# Patient Record
Sex: Male | Born: 1970 | Race: White | Hispanic: No | Marital: Married | Smoking: Never smoker
Health system: Southern US, Community
[De-identification: ages and names within clinical notes are randomized; demographics above are authoritative.]

## PROBLEM LIST (undated history)

## (undated) DIAGNOSIS — Z9889 Other specified postprocedural states: Secondary | ICD-10-CM

## (undated) DIAGNOSIS — N200 Calculus of kidney: Secondary | ICD-10-CM

## (undated) DIAGNOSIS — R112 Nausea with vomiting, unspecified: Secondary | ICD-10-CM

## (undated) DIAGNOSIS — F329 Major depressive disorder, single episode, unspecified: Secondary | ICD-10-CM

## (undated) DIAGNOSIS — F32A Depression, unspecified: Secondary | ICD-10-CM

## (undated) DIAGNOSIS — F419 Anxiety disorder, unspecified: Secondary | ICD-10-CM

## (undated) HISTORY — DX: Calculus of kidney: N20.0

## (undated) HISTORY — PX: EYE SURGERY: SHX253

## (undated) HISTORY — PX: FRACTURE SURGERY: SHX138

---

## 1992-07-13 HISTORY — PX: FEMUR SURGERY: SHX943

## 1998-11-02 ENCOUNTER — Emergency Department (HOSPITAL_COMMUNITY): Admission: EM | Admit: 1998-11-02 | Discharge: 1998-11-02 | Payer: Self-pay | Admitting: Emergency Medicine

## 2013-10-18 ENCOUNTER — Ambulatory Visit
Admission: RE | Admit: 2013-10-18 | Discharge: 2013-10-18 | Disposition: A | Discharge status: Home / Self Care | Payer: BC Managed Care – PPO | Source: Ambulatory Visit | Attending: Family Medicine | Admitting: Family Medicine

## 2013-10-18 ENCOUNTER — Other Ambulatory Visit: Payer: Self-pay | Admitting: Family Medicine

## 2013-10-18 DIAGNOSIS — R109 Unspecified abdominal pain: Secondary | ICD-10-CM

## 2013-10-18 DIAGNOSIS — R17 Unspecified jaundice: Secondary | ICD-10-CM

## 2013-10-19 ENCOUNTER — Ambulatory Visit (HOSPITAL_COMMUNITY): Payer: BC Managed Care – PPO

## 2013-10-19 ENCOUNTER — Other Ambulatory Visit: Payer: Self-pay | Admitting: Gastroenterology

## 2013-10-19 ENCOUNTER — Encounter (HOSPITAL_COMMUNITY): Payer: Self-pay | Admitting: *Deleted

## 2013-10-19 ENCOUNTER — Ambulatory Visit (HOSPITAL_COMMUNITY)
Admission: AD | Admit: 2013-10-19 | Discharge: 2013-10-19 | Disposition: A | Discharge status: Home / Self Care | Payer: BC Managed Care – PPO | Source: Ambulatory Visit | Attending: Gastroenterology | Admitting: Gastroenterology

## 2013-10-19 ENCOUNTER — Encounter (HOSPITAL_COMMUNITY)
Admission: AD | Disposition: A | Discharge status: Home / Self Care | Payer: Self-pay | Source: Ambulatory Visit | Attending: Gastroenterology

## 2013-10-19 DIAGNOSIS — K838 Other specified diseases of biliary tract: Secondary | ICD-10-CM | POA: Insufficient documentation

## 2013-10-19 DIAGNOSIS — R748 Abnormal levels of other serum enzymes: Secondary | ICD-10-CM | POA: Insufficient documentation

## 2013-10-19 DIAGNOSIS — K560 Paralytic ileus: Secondary | ICD-10-CM | POA: Insufficient documentation

## 2013-10-19 DIAGNOSIS — R1013 Epigastric pain: Secondary | ICD-10-CM

## 2013-10-19 DIAGNOSIS — R17 Unspecified jaundice: Secondary | ICD-10-CM | POA: Insufficient documentation

## 2013-10-19 HISTORY — DX: Nausea with vomiting, unspecified: R11.2

## 2013-10-19 HISTORY — DX: Major depressive disorder, single episode, unspecified: F32.9

## 2013-10-19 HISTORY — DX: Other specified postprocedural states: Z98.890

## 2013-10-19 HISTORY — DX: Depression, unspecified: F32.A

## 2013-10-19 HISTORY — DX: Anxiety disorder, unspecified: F41.9

## 2013-10-19 HISTORY — PX: ERCP: SHX5425

## 2013-10-19 SURGERY — ERCP, WITH INTERVENTION IF INDICATED
Anesthesia: Moderate Sedation

## 2013-10-19 MED ORDER — BUTAMBEN-TETRACAINE-BENZOCAINE 2-2-14 % EX AERO
INHALATION_SPRAY | CUTANEOUS | Status: DC | PRN
  Administered 2013-10-19: 2 via TOPICAL

## 2013-10-19 MED ORDER — FENTANYL CITRATE 0.05 MG/ML IJ SOLN
INTRAMUSCULAR | Status: DC | PRN
  Administered 2013-10-19 (×8): 25 ug via INTRAVENOUS

## 2013-10-19 MED ORDER — FENTANYL CITRATE 0.05 MG/ML IJ SOLN
INTRAMUSCULAR | Status: AC
  Filled 2013-10-19: qty 6

## 2013-10-19 MED ORDER — DIPHENHYDRAMINE HCL 50 MG/ML IJ SOLN
INTRAMUSCULAR | Status: DC | PRN
  Administered 2013-10-19 (×2): 25 mg via INTRAVENOUS

## 2013-10-19 MED ORDER — CIPROFLOXACIN IN D5W 400 MG/200ML IV SOLN
400.0000 mg | Freq: Once | INTRAVENOUS | Status: AC
  Administered 2013-10-19: 400 mg via INTRAVENOUS

## 2013-10-19 MED ORDER — DIPHENHYDRAMINE HCL 50 MG/ML IJ SOLN
INTRAMUSCULAR | Status: AC
  Filled 2013-10-19: qty 1

## 2013-10-19 MED ORDER — IOHEXOL 350 MG/ML SOLN
INTRAVENOUS | Status: DC | PRN
  Administered 2013-10-19: 15:00:00

## 2013-10-19 MED ORDER — MIDAZOLAM HCL 10 MG/2ML IJ SOLN
INTRAMUSCULAR | Status: DC | PRN
  Administered 2013-10-19 (×3): 2 mg via INTRAVENOUS
  Administered 2013-10-19: 1 mg via INTRAVENOUS
  Administered 2013-10-19: 2.5 mg via INTRAVENOUS
  Administered 2013-10-19: 2 mg via INTRAVENOUS
  Administered 2013-10-19: 2.5 mg via INTRAVENOUS
  Administered 2013-10-19 (×2): 2 mg via INTRAVENOUS

## 2013-10-19 MED ORDER — GLUCAGON HCL (RDNA) 1 MG IJ SOLR
INTRAMUSCULAR | Status: AC
  Filled 2013-10-19: qty 2

## 2013-10-19 MED ORDER — GLUCAGON HCL (RDNA) 1 MG IJ SOLR
INTRAMUSCULAR | Status: DC | PRN
  Administered 2013-10-19: .5 mg via INTRAVENOUS

## 2013-10-19 MED ORDER — MIDAZOLAM HCL 10 MG/2ML IJ SOLN
INTRAMUSCULAR | Status: AC
  Filled 2013-10-19: qty 4

## 2013-10-19 MED ORDER — ONDANSETRON HCL 4 MG/2ML IJ SOLN
INTRAMUSCULAR | Status: AC
  Filled 2013-10-19: qty 2

## 2013-10-19 MED ORDER — SODIUM CHLORIDE 0.9 % IV SOLN
INTRAVENOUS | Status: DC
  Administered 2013-10-19: 500 mL via INTRAVENOUS

## 2013-10-19 MED ORDER — CIPROFLOXACIN IN D5W 400 MG/200ML IV SOLN
INTRAVENOUS | Status: AC
  Filled 2013-10-19: qty 200

## 2013-10-19 NOTE — Discharge Instructions (Signed)
Diet The clear liquid diet consists of foods that are liquid or will become liquid at room temperature. Examples of foods allowed on a clear liquid diet include fruit juice, broth or bouillon, gelatin, or frozen ice pops. You should be able to see through the liquid. The purpose of this diet is to provide the necessary fluids, electrolytes (such as sodium and potassium), and energy to keep the body functioning during times when you are not able to consume a regular diet. A clear liquid diet should not be continued for long periods of time, as it is not nutritionally adequate.  A CLEAR LIQUID DIET MAY BE NEEDED:  When a sudden-onset (acute) condition occurs before or after surgery.   As the first step in oral feeding.   For fluid and electrolyte replacement in diarrheal diseases.   As a diet before certain medical tests are performed.  ADEQUACY The clear liquid diet is adequate only in ascorbic acid, according to the Recommended Dietary Allowances of the National Research Council.  CHOOSING FOODS Breads and Starches  Allowed: None are allowed.   Avoid: All are to be avoided.  Vegetables  Allowed: Strained vegetable juices.   Avoid: Any others.  Fruit  Allowed: Strained fruit juices and fruit drinks. Include 1 serving of citrus or vitamin C-enriched fruit juice daily.   Avoid: Any others.  Meat and Meat Substitutes  Allowed: None are allowed.   Avoid: All are to be avoided.  Milk Products  Allowed: None are allowed.   Avoid: All are to be avoided.  Soups and Combination Foods  Allowed: Clear bouillon, broth, or strained broth-based soups.   Avoid: Any others.  Desserts and Sweets  Allowed: Sugar, honey. High-protein gelatin. Flavored gelatin, ices, or frozen ice pops that do not contain milk.   Avoid: Any others.  Fats and Oils  Allowed: None are allowed.   Avoid: All are to be avoided.  Beverages  Allowed: Cereal  beverages, coffee (regular or decaffeinated), tea, or soda at the discretion of your health care provider.   Avoid: Any others.  Condiments  Allowed: Salt.   Avoid: Any others, including pepper.  Supplements  Allowed: Liquid nutrition beverages that you can see through.   Avoid: Any others that contain lactose or fiber. SAMPLE MEAL PLAN Breakfast  4 oz (120 mL) strained orange juice.   to 1 cup (120 to 240 mL) gelatin (plain or fortified).  1 cup (240 mL) beverage (coffee or tea).  Sugar, if desired. Midmorning Snack   cup (120 mL) gelatin (plain or fortified). Lunch  1 cup (240 mL) broth or consomm.  4 oz (120 mL) strained grapefruit juice.   cup (120 mL) gelatin (plain or fortified).  1 cup (240 mL) beverage (coffee or tea).  Sugar, if desired. Midafternoon Snack   cup (120 mL) fruit ice.   cup (120 mL) strained fruit juice. Dinner  1 cup (240 mL) broth or consomm.   cup (120 mL) cranberry juice.   cup (120 mL) flavored gelatin (plain or fortified).  1 cup (240 mL) beverage (coffee or tea).  Sugar, if desired. Evening Snack  4 oz (120 mL) strained apple juice (vitamin C-fortified).   cup (120 mL) flavored gelatin (plain or fortified). MAKE SURE YOU:  Understand these instructions.  Will watch your child's condition.  Will get help right away if your child is not doing well or gets worse. Document Released: 06/29/2005 Document Revised: 03/01/2013 Document Reviewed: 11/29/2012 ExitCare Patient Information 2014 [employer].  

## 2013-10-19 NOTE — Op Note (Signed)
[facility] [address] [place], [id number]   ERCP PROCEDURE REPORT  PATIENT: Timothy BockHolbrook, Timothy A.  MR# :[id number] BIRTHDATE: 08-27-1970  GENDER: Male ENDOSCOPIST: Dorena CookeyJohn Nabria Nevin, MD REFERRED BY: PROCEDURE DATE:  10/19/2013 PROCEDURE: ASA CLASS: INDICATIONS:suspected common bile duct stone MEDICATIONS: 225 mcg IV fentanyl, 20 mg IV Versed, 50 mg IV Benadryl   0.5 mg IV glucagon TOPICAL ANESTHETIC:  DESCRIPTION OF PROCEDURE:   After the risks benefits and alternatives of the procedure were thoroughly explained, informed consent was obtained.  The Pentax side-viewing       endoscope was introduced through the mouth  and advanced to the papilla of Vater      .  it had a normal, somewhat generous appearance. After several attempts with a guidewire selective deep cannulation of the common bile duct was achieved. A cholangiogram was obtained which showed a diffusely dilated common bile duct with a patent cystic duct and some opacification of the gallbladder. I thought I could see some sludge or vague stone in the mid common bile duct. A generous sphincterotomy was performed and a 12-15 adjustable balloon catheter was advanced into the common hepatic duct, inflated and pulled down through the common bile duct through the sphincterotomy size papilla. There was a lot of intestinalspasm at that time necessitating the use of glucagon but I could not clearly see any stones delivered.   several more cholangiograms and several more balloon sweeps of the common bile duct were made but I could not clearly see any further stones although some air bubbles were introduced into the common bile duct. The pancreatic duct was not opacified or cannulated with the guidewire. after several further balloon sweeps were made the scope was withdrawn. Drainage of contrast was minimal at the time of the procedure and I did not see any bile exiting from the sphincterotomy I asked papilla.     The scope was then completely withdrawn from the patient and the procedure terminated . Arrangements were made for a 30 minute post procedure cholangiogram to check for drainage.     COMPLICATIONS: none immediate  ENDOSCOPIC IMPRESSION:dilated common bile duct, questionable stone seen on cholangiogram but none seen delivered  RECOMMENDATIONS:Will check postprocedural KUB for drainage, recheck liver function tests in the morning. Eventual referral for elective cholecystectomy based on presentation of probable common bile duct stone as well as sludge in the gallbladder and thickening of the gallbladder wall.     _______________________________ Rosalie DoctoreSignedDorena Cookey:  Timothy Rodriques, MD 10/19/2013 3:26 PM   CC:

## 2013-10-19 NOTE — H&P (Signed)
   [facility] Admission History & Physical  Chief Complaint: Abdominal pain and jaundice HPI: Timothy Lester is an 43 y.o. white male.  Who presents for ERCP. He developed epigastric and chest pain which progressed over the last 2-3 days with associated jaundice. He was found to have gallbladder sludge and a dilated bile duct at 1.1 cm with elevated liver function tests with an ALT over 600 and a bilirubin of 5.0.  Past Medical History  Diagnosis Date  . PONV (postoperative nausea and vomiting)   . Anxiety   . Depression     Past Surgical History  Procedure Laterality Date  . Fracture surgery      No prescriptions prior to admission    Allergies: Not on File  History reviewed. No pertinent family history.  Social History:  reports that he has never smoked. He does not have any smokeless tobacco history on file. He reports that he does not drink alcohol or use illicit drugs.  Review of Systems: negative except as above with a dark urine noted in the last 2 days.   Blood pressure 128/76, temperature 98.2 F (36.8 C), temperature source Oral, resp. rate 14, height 5' 10" (1.778 m), weight 86.183 kg (190 lb), SpO2 99.00%. Head: Normocephalic, without obvious abnormality, atraumatic Neck: no adenopathy, no carotid bruit, no JVD, supple, symmetrical, trachea midline and thyroid not enlarged, symmetric, no tenderness/mass/nodules Resp: clear to auscultation bilaterally Cardio: regular rate and rhythm, S1, S2 normal, no murmur, click, rub or gallop GI: Abdomen soft nondistended with mild epigastric tenderness with no rebound or guarding. Extremities: extremities normal, atraumatic, no cyanosis or edema  No results found for this or any previous visit (from the past 48 hour(s)). [place] Abdomen Complete  10/18/2013   CLINICAL DATA:  Jaundice.  Abdominal pain.  Question gallstones.  EXAM: ULTRASOUND ABDOMEN COMPLETE  COMPARISON:  None.  FINDINGS: Gallbladder:  The gallbladder is  filled with sludge. Areas of ring down artifact are identified in the gallbladder wall appears mildly thickened at 0.4 cm. Sonographer reports negative Murphy's sign. No pericholecystic fluid is identified.  Common bile duct:  Diameter: 1.1 cm, dilated.  No intraductal stone is seen.  Liver:  No focal lesion identified. Within normal limits in parenchymal echogenicity.  IVC:  No abnormality visualized.  Pancreas:  Visualized portion unremarkable.  Spleen:  Size and appearance within normal limits.  Right Kidney:  Length: 12.6 cm. Echogenicity within normal limits. No mass or hydronephrosis visualized.  Left Kidney:  Length: 12.0 cm. Echogenicity within normal limits. No mass or hydronephrosis visualized. 0.6 cm echogenic focus in the lower pole is compatible with a nonobstructing stone.  Abdominal aorta:  No aneurysm visualized.  Other findings:  None.  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 No evidence of acute cholecystitis is identified.  The common bile duct is dilated 1.1 cm but there is no intrahepatic biliary ductal dilatation. No stone in the common duct is seen.  0.6 cm nonobstructing stone left kidney.   Electronically Signed   By: Thomas  Dalessio M.D.   On: 10/18/2013 14:54    Assessment: Gallbladder sludge with dilated bile duct suggesting common bile duct stone associated with jaundice and elevated liver function tests. Plan: Will proceed with ERCP with possible stone extraction. Risks, rationale, limitations, and alternatives explained to the patient and he wishes to proceed.  Fadumo Heng C Reagan Behlke 10/19/2013, 2:06 PM  

## 2013-10-19 NOTE — Addendum Note (Signed)
Addended by: Dorena CookeyHAYES, Carter Kaman on: 10/19/2013 12:48 PM   Modules accepted: Orders

## 2013-10-20 ENCOUNTER — Encounter (HOSPITAL_COMMUNITY): Payer: Self-pay | Admitting: Gastroenterology

## 2013-10-25 ENCOUNTER — Encounter (HOSPITAL_COMMUNITY): Payer: Self-pay | Admitting: *Deleted

## 2013-10-25 ENCOUNTER — Other Ambulatory Visit: Payer: Self-pay | Admitting: Gastroenterology

## 2013-10-26 ENCOUNTER — Encounter (HOSPITAL_COMMUNITY): Payer: Self-pay | Admitting: *Deleted

## 2013-10-26 ENCOUNTER — Encounter (HOSPITAL_COMMUNITY)
Admission: RE | Disposition: A | Discharge status: Home / Self Care | Payer: Self-pay | Source: Ambulatory Visit | Attending: Gastroenterology

## 2013-10-26 ENCOUNTER — Encounter (HOSPITAL_COMMUNITY): Payer: BC Managed Care – PPO | Admitting: Certified Registered"

## 2013-10-26 ENCOUNTER — Ambulatory Visit (HOSPITAL_COMMUNITY)
Admission: RE | Admit: 2013-10-26 | Discharge: 2013-10-26 | Disposition: A | Discharge status: Home / Self Care | Payer: BC Managed Care – PPO | Source: Ambulatory Visit | Attending: Gastroenterology | Admitting: Gastroenterology

## 2013-10-26 ENCOUNTER — Ambulatory Visit (HOSPITAL_COMMUNITY): Payer: BC Managed Care – PPO | Admitting: Certified Registered"

## 2013-10-26 DIAGNOSIS — R109 Unspecified abdominal pain: Secondary | ICD-10-CM | POA: Insufficient documentation

## 2013-10-26 DIAGNOSIS — F411 Generalized anxiety disorder: Secondary | ICD-10-CM | POA: Insufficient documentation

## 2013-10-26 DIAGNOSIS — F3289 Other specified depressive episodes: Secondary | ICD-10-CM | POA: Insufficient documentation

## 2013-10-26 DIAGNOSIS — R7989 Other specified abnormal findings of blood chemistry: Secondary | ICD-10-CM | POA: Insufficient documentation

## 2013-10-26 DIAGNOSIS — K838 Other specified diseases of biliary tract: Secondary | ICD-10-CM | POA: Insufficient documentation

## 2013-10-26 DIAGNOSIS — F329 Major depressive disorder, single episode, unspecified: Secondary | ICD-10-CM | POA: Insufficient documentation

## 2013-10-26 HISTORY — PX: EUS: SHX5427

## 2013-10-26 SURGERY — ESOPHAGEAL ENDOSCOPIC ULTRASOUND (EUS) RADIAL
Anesthesia: General

## 2013-10-26 MED ORDER — CIPROFLOXACIN IN D5W 400 MG/200ML IV SOLN
INTRAVENOUS | Status: AC
  Filled 2013-10-26: qty 200

## 2013-10-26 MED ORDER — LACTATED RINGERS IV SOLN
INTRAVENOUS | Status: DC | PRN
  Administered 2013-10-26 (×2): via INTRAVENOUS

## 2013-10-26 MED ORDER — MIDAZOLAM HCL 5 MG/5ML IJ SOLN
INTRAMUSCULAR | Status: DC | PRN
  Administered 2013-10-26: 1 mg via INTRAVENOUS
  Administered 2013-10-26: 2 mg via INTRAVENOUS

## 2013-10-26 MED ORDER — CIPROFLOXACIN IN D5W 400 MG/200ML IV SOLN
400.0000 mg | Freq: Once | INTRAVENOUS | Status: AC
  Administered 2013-10-26: 400 mg via INTRAVENOUS

## 2013-10-26 MED ORDER — SODIUM CHLORIDE 0.9 % IV SOLN: INTRAVENOUS | Status: DC

## 2013-10-26 MED ORDER — LIDOCAINE HCL (CARDIAC) 20 MG/ML IV SOLN
INTRAVENOUS | Status: DC | PRN
  Administered 2013-10-26: 40 mg via INTRAVENOUS

## 2013-10-26 MED ORDER — LACTATED RINGERS IV SOLN
INTRAVENOUS | Status: DC
  Administered 2013-10-26: 12:00:00 via INTRAVENOUS

## 2013-10-26 MED ORDER — DIPHENHYDRAMINE HCL 50 MG/ML IJ SOLN
INTRAMUSCULAR | Status: DC | PRN
  Administered 2013-10-26: 25 mg via INTRAVENOUS

## 2013-10-26 MED ORDER — ONDANSETRON HCL 4 MG/2ML IJ SOLN
INTRAMUSCULAR | Status: DC | PRN
  Administered 2013-10-26: 4 mg via INTRAVENOUS

## 2013-10-26 MED ORDER — GLYCOPYRROLATE 0.2 MG/ML IJ SOLN
INTRAMUSCULAR | Status: DC | PRN
  Administered 2013-10-26: 0.6 mg via INTRAVENOUS

## 2013-10-26 MED ORDER — ROCURONIUM BROMIDE 100 MG/10ML IV SOLN
INTRAVENOUS | Status: DC | PRN
  Administered 2013-10-26: 40 mg via INTRAVENOUS

## 2013-10-26 MED ORDER — NEOSTIGMINE METHYLSULFATE 1 MG/ML IJ SOLN
INTRAMUSCULAR | Status: DC | PRN
  Administered 2013-10-26: 4 mg via INTRAVENOUS

## 2013-10-26 MED ORDER — PROPOFOL 10 MG/ML IV BOLUS
INTRAVENOUS | Status: DC | PRN
  Administered 2013-10-26: 200 mg via INTRAVENOUS

## 2013-10-26 MED ORDER — FENTANYL CITRATE 0.05 MG/ML IJ SOLN
INTRAMUSCULAR | Status: DC | PRN
  Administered 2013-10-26: 100 ug via INTRAVENOUS

## 2013-10-26 NOTE — Anesthesia Procedure Notes (Signed)
Procedure Name: Intubation Date/Time: 10/26/2013 1:04 PM Performed by: Orvilla FusATO, Viren Lebeau A Pre-anesthesia Checklist: Patient identified, Patient being monitored, Emergency Drugs available, Timeout performed and Suction available Patient Re-evaluated:Patient Re-evaluated prior to inductionOxygen Delivery Method: Circle system utilized Intubation Type: IV induction Ventilation: Mask ventilation without difficulty and Oral airway inserted - appropriate to patient size Laryngoscope Size: Mac and 3 Grade View: Grade I Tube type: Oral Tube size: 7.5 mm Number of attempts: 1 Airway Equipment and Method: Stylet Placement Confirmation: ETT inserted through vocal cords under direct vision,  breath sounds checked- equal and bilateral and positive ETCO2 Secured at: 23 cm Tube secured with: Tape Dental Injury: Teeth and Oropharynx as per pre-operative assessment

## 2013-10-26 NOTE — Interval H&P Note (Signed)
History and Physical Interval Note:  10/26/2013 12:56 PM  Timothy Lester  has presented today for surgery, with the diagnosis of abd. pain/elevated lft  The various methods of treatment have been discussed with the patient and family. After consideration of risks, benefits and other options for treatment, the patient has consented to  Procedure(s): ESOPHAGEAL ENDOSCOPIC ULTRASOUND (EUS) RADIAL (N/A) ENDOSCOPIC RETROGRADE CHOLANGIOPANCREATOGRAPHY (ERCP) (N/A) as a surgical intervention .  The patient's history has been reviewed, patient examined, no change in status, stable for surgery.  I have reviewed the patient's chart and labs.  Questions were answered to the patient's satisfaction.     [name]

## 2013-10-26 NOTE — Discharge Instructions (Signed)
Endoscopy/Endoscopic ultrasound ° °Care After °Please read the instructions outlined below and refer to this sheet in the next few weeks. These discharge instructions provide you with general information on caring for yourself after you leave the hospital. Your doctor may also give you specific instructions. While your treatment has been planned according to the most current medical practices available, unavoidable complications occasionally occur. If you have any problems or questions after discharge, please call Dr. Outlaw ([facility]) at [phone]. ° °HOME CARE INSTRUCTIONS °Activity °· You may resume your regular activity but move at a slower pace for the next 24 hours.  °· Take frequent rest periods for the next 24 hours.  °· Walking will help expel (get rid of) the air and reduce the bloated feeling in your abdomen.  °· No driving for 24 hours (because of the anesthesia (medicine) used during the test).  °· You may shower.  °· Do not sign any important legal documents or operate any machinery for 24 hours (because of the anesthesia used during the test).  °Nutrition °· Drink plenty of fluids.  °· You may resume your normal diet.  °· Begin with a light meal and progress to your normal diet.  °· Avoid alcoholic beverages for 24 hours or as instructed by your caregiver.  °Medications °You may resume your normal medications unless your caregiver tells you otherwise. °What you can expect today °· You may experience abdominal discomfort such as a feeling of fullness or "gas" pains.  °· You may experience a sore throat for 2 to 3 days. This is normal. Gargling with salt water may help this.  °·  °SEEK IMMEDIATE MEDICAL CARE IF: °· You have excessive nausea (feeling sick to your stomach) and/or vomiting.  °· You have severe abdominal pain and distention (swelling).  °· You have trouble swallowing.  °· You have a temperature over 100° F (37.8° C).  °· You have rectal bleeding or vomiting of blood.   °Document Released: 02/11/2004 Document Revised: 03/11/2011 Document Reviewed: 08/24/2007 °ExitCare® Patient Information ©2012 [employer]. °

## 2013-10-26 NOTE — Transfer of Care (Signed)
Immediate Anesthesia Transfer of Care Note  Patient: Timothy Lester  Procedure(s) Performed: Procedure(s): ESOPHAGEAL ENDOSCOPIC ULTRASOUND (EUS) RADIAL (N/A)  Patient Location: Endoscopy Unit  Anesthesia Type:General  Level of Consciousness: sedated  Airway & Oxygen Therapy: Patient Spontanous Breathing and Patient connected to nasal cannula oxygen  Post-op Assessment: Report given to PACU RN, Post -op Vital signs reviewed and stable and Patient moving all extremities  Post vital signs: Reviewed and stable  Complications: No apparent anesthesia complications

## 2013-10-26 NOTE — Anesthesia Preprocedure Evaluation (Addendum)
Anesthesia Evaluation  Patient identified by MRN, date of birth, ID band Patient awake    Reviewed: Allergy & Precautions, H&P , NPO status , Patient's Chart, lab work & pertinent test results  History of Anesthesia Complications (+) PONV and history of anesthetic complications  Airway Mallampati: II TM Distance: >3 FB Neck ROM: Full    Dental  (+) Teeth Intact, Dental Advisory Given   Pulmonary  breath sounds clear to auscultation        Cardiovascular Rhythm:Regular Rate:Normal     Neuro/Psych    GI/Hepatic Elevated LFTs Gallbladder sludge   Endo/Other    Renal/GU      Musculoskeletal   Abdominal   Peds  Hematology   Anesthesia Other Findings   Reproductive/Obstetrics                         Anesthesia Physical Anesthesia Plan  ASA: II  Anesthesia Plan: General   Post-op Pain Management:    Induction: Intravenous  Airway Management Planned: Oral ETT  Additional Equipment:   Intra-op Plan:   Post-operative Plan: Extubation in OR  Informed Consent: I have reviewed the patients History and Physical, chart, labs and discussed the procedure including the risks, benefits and alternatives for the proposed anesthesia with the patient or authorized representative who has indicated his/her understanding and acceptance.   Dental advisory given  Plan Discussed with: Anesthesiologist and CRNA  Anesthesia Plan Comments: (H/O Jaundice with elevated LFTs  Plan GA with oral ETT  Kipp Broodavid Joslin, MD)        Anesthesia Quick Evaluation

## 2013-10-26 NOTE — Interval H&P Note (Signed)
History and Physical Interval Note:  10/26/2013 12:55 PM  Timothy Lester  has presented today for surgery, with the diagnosis of abd. pain/elevated lft  The various methods of treatment have been discussed with the patient and family. After consideration of risks, benefits and other options for treatment, the patient has consented to  Procedure(s): ESOPHAGEAL ENDOSCOPIC ULTRASOUND (EUS) RADIAL (N/A) ENDOSCOPIC RETROGRADE CHOLANGIOPANCREATOGRAPHY (ERCP) (N/A) as a surgical intervention .  The patient's history has been reviewed, patient examined, no change in status, stable for surgery.  I have reviewed the patient's chart and labs.  Questions were answered to the patient's satisfaction.     Barrie FolkJohn C Michele Judy

## 2013-10-26 NOTE — Addendum Note (Signed)
Addended by: Willis ModenaUTLAW, Khaniyah Bezek on: 10/26/2013 11:39 AM   Modules accepted: Orders

## 2013-10-26 NOTE — H&P (View-Only) (Signed)
   [facility] Admission History & Physical  Chief Complaint: Abdominal pain and jaundice HPI: Timothy Lester is an 43 y.o. white male.  Who presents for ERCP. He developed epigastric and chest pain which progressed over the last 2-3 days with associated jaundice. He was found to have gallbladder sludge and a dilated bile duct at 1.1 cm with elevated liver function tests with an ALT over 600 and a bilirubin of 5.0.  Past Medical History  Diagnosis Date  . PONV (postoperative nausea and vomiting)   . Anxiety   . Depression     Past Surgical History  Procedure Laterality Date  . Fracture surgery      No prescriptions prior to admission    Allergies: Not on File  History reviewed. No pertinent family history.  Social History:  reports that he has never smoked. He does not have any smokeless tobacco history on file. He reports that he does not drink alcohol or use illicit drugs.  Review of Systems: negative except as above with a dark urine noted in the last 2 days.   Blood pressure 128/76, temperature 98.2 F (36.8 C), temperature source Oral, resp. rate 14, height 5\' 10"  (1.778 m), weight 86.183 kg (190 lb), SpO2 99.00%. Head: Normocephalic, without obvious abnormality, atraumatic Neck: no adenopathy, no carotid bruit, no JVD, supple, symmetrical, trachea midline and thyroid not enlarged, symmetric, no tenderness/mass/nodules Resp: clear to auscultation bilaterally Cardio: regular rate and rhythm, S1, S2 normal, no murmur, click, rub or gallop GI: Abdomen soft nondistended with mild epigastric tenderness with no rebound or guarding. Extremities: extremities normal, atraumatic, no cyanosis or edema  No results found for this or any previous visit (from the past 48 hour(s)). [place] Abdomen Complete  10/18/2013   CLINICAL DATA:  Jaundice.  Abdominal pain.  Question gallstones.  EXAM: ULTRASOUND ABDOMEN COMPLETE  COMPARISON:  None.  FINDINGS: Gallbladder:  The gallbladder is  filled with sludge. Areas of ring down artifact are identified in the gallbladder wall appears mildly thickened at 0.4 cm. Sonographer reports negative Murphy's sign. No pericholecystic fluid is identified.  Common bile duct:  Diameter: 1.1 cm, dilated.  No intraductal stone is seen.  Liver:  No focal lesion identified. Within normal limits in parenchymal echogenicity.  IVC:  No abnormality visualized.  Pancreas:  Visualized portion unremarkable.  Spleen:  Size and appearance within normal limits.  Right Kidney:  Length: 12.6 cm. Echogenicity within normal limits. No mass or hydronephrosis visualized.  Left Kidney:  Length: 12.0 cm. Echogenicity within normal limits. No mass or hydronephrosis visualized. 0.6 cm echogenic focus in the lower pole is compatible with a nonobstructing stone.  Abdominal aorta:  No aneurysm visualized.  Other findings:  None.  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 No evidence of acute cholecystitis is identified.  The common bile duct is dilated 1.1 cm but there is no intrahepatic biliary ductal dilatation. No stone in the common duct is seen.  0.6 cm nonobstructing stone left kidney.   Electronically Signed   By: Drusilla Kannerhomas  Dalessio M.D.   On: 10/18/2013 14:54    Assessment: Gallbladder sludge with dilated bile duct suggesting common bile duct stone associated with jaundice and elevated liver function tests. Plan: Will proceed with ERCP with possible stone extraction. Risks, rationale, limitations, and alternatives explained to the patient and he wishes to proceed.  Barrie FolkJohn C Johnluke Haugen 10/19/2013, 2:06 PM

## 2013-10-26 NOTE — Interval H&P Note (Signed)
History and Physical Interval Note:  10/26/2013 12:36 PM  Timothy Lester  has presented today for surgery, with the diagnosis of abd. pain/elevated lft  The various methods of treatment have been discussed with the patient and family. After consideration of risks, benefits and other options for treatment, the patient has consented to  Procedure(s): ESOPHAGEAL ENDOSCOPIC ULTRASOUND (EUS) RADIAL (N/A) ENDOSCOPIC RETROGRADE CHOLANGIOPANCREATOGRAPHY (ERCP) (N/A) as a surgical intervention .  The patient's history has been reviewed, patient examined, no change in status, stable for surgery.  I have reviewed the patient's chart and labs.  Questions were answered to the patient's satisfaction.     Timothy Lester  Assessment:  1.  Elevated liver enzymes, slowly downtrending. 2.  Abdominal pain, resolved. 3.  Dilated bile duct, s/p ERCP with biliary sphincterotomy.  Plan:  1.  Endoscopic ultrasound with possible fine needle aspiration biopsies. 2.  Risks (bleeding, infection, bowel perforation that could require surgery, sedation-related changes in cardiopulmonary systems), benefits (identification and possible treatment of source of symptoms, exclusion of certain causes of symptoms), and alternatives (watchful waiting, radiographic imaging studies, empiric medical treatment) of upper endoscopy with ultrasound and possible biopsies (EUS +/- FNA) were explained to patient/family in detail and patient wishes to proceed. 3.  Possible ERCP to follow by Dr. Madilyn FiremanHayes. 4.  Risks (up to and including bleeding, infection, perforation, pancreatitis that can be complicated by infected necrosis and death), benefits (removal of stones, alleviating blockage, decreasing risk of cholangitis or choledocholithiasis-related pancreatitis), and alternatives (watchful waiting, percutaneous transhepatic cholangiography) of ERCP were explained to patient/family in detail and patient elects to proceed.

## 2013-10-26 NOTE — Anesthesia Postprocedure Evaluation (Signed)
  Anesthesia Post-op Note  Patient: Timothy Lester  Procedure(s) Performed: Procedure(s): ESOPHAGEAL ENDOSCOPIC ULTRASOUND (EUS) RADIAL (N/A)  Patient Location: PACU  Anesthesia Type:General  Level of Consciousness: awake, oriented and sedated  Airway and Oxygen Therapy: Patient Spontanous Breathing and Patient connected to nasal cannula oxygen  Post-op Pain: none  Post-op Assessment: Post-op Vital signs reviewed, Patient's Cardiovascular Status Stable, Respiratory Function Stable, Patent Airway and Pain level controlled  Post-op Vital Signs: stable  Last Vitals:  Filed Vitals:   10/26/13 1403  BP:   Pulse: 85  Temp: 36.4 C  Resp: 12    Complications: No apparent anesthesia complications

## 2013-10-26 NOTE — Op Note (Signed)
[facility] [address] [place], [id number]   ENDOSCOPIC ULTRASOUND PROCEDURE REPORT  PATIENT: Timothy Lester, Timothy A.  MR#: [id number] BIRTHDATE: [birth year]  GENDER: Male ENDOSCOPIST: Willis ModenaWilliam Skye Rodarte, MD REFERRED BY:  Dorena CookeyJohn Hayes, M.D. PROCEDURE DATE:  10/26/2013 PROCEDURE:   Upper EUS ASA CLASS:      Class II INDICATIONS:   1.  abdominal pain, elevated LFTs, dilated CBD. MEDICATIONS: General endotracheal anesthesia (GETA), Cipro 400 mg IV, and Benadryl 25 mg IV  DESCRIPTION OF PROCEDURE:   After the risks benefits and alternatives of the procedure were  explained, informed consent was obtained. The patient was then placed in the left, lateral, decubitus postion and IV sedation was administered. Throughout the procedure, the patients blood pressure, pulse and oxygen saturations were monitored continuously.  Under direct visualization, the Pentax Radial EUS [id number]  endoscope was introduced through the mouth  and advanced to the second portion of the duodenum .  Water was used as necessary to provide an acoustic interface.  Upon completion of the imaging, water was removed and the patient was sent to the recovery room in satisfactory condition.     FINDINGS:        Extrahepatic bile duct about 7mm in CBD and 9mm in CHD.  There was diffuse reactive-appearing symmetrical bile duct wall thickening.  No focal bile duct stone was seen.  Gallbladder was symmetrically thickened, with lots of sludge and stone crystals.  Couple reactive-appearing triangular periportal lymph nodes.  Ampulla prominent, but otherwise normal via EUS and endoscopically.  No obvious bile duct stricture seen.  No pancreatic mass seen.  Bile was witnessed to flow freely through the ampulla at time of procedure.  IMPRESSION:     As above.  Suspect patient has passed a bile duct stone, and suspect his elevated LFTs were either from seemingly resolving distal CBD stricture or from  papillary stenosis.  RECOMMENDATIONS:     1.  Watch for potential complications of procedure. 2.  Based on today's exam findings, don't see appreciable benefit from repeat ERCP, especially as his LFTs are improving. 3.  Would consider surgical referral for consideration of cholecystectomy. 4.  Will discuss with Dr. Madilyn FiremanHayes.   _______________________________ Rosalie DoctoreSignedWillis Modena:  Timothy Vasco, MD 10/26/2013 1:38 PM   CC:

## 2013-10-27 ENCOUNTER — Encounter (HOSPITAL_COMMUNITY): Payer: Self-pay | Admitting: Gastroenterology

## 2013-11-14 ENCOUNTER — Telehealth (INDEPENDENT_AMBULATORY_CARE_PROVIDER_SITE_OTHER): Payer: Self-pay | Admitting: General Surgery

## 2013-11-14 ENCOUNTER — Ambulatory Visit (INDEPENDENT_AMBULATORY_CARE_PROVIDER_SITE_OTHER): Payer: BC Managed Care – PPO | Admitting: General Surgery

## 2013-11-14 ENCOUNTER — Encounter (INDEPENDENT_AMBULATORY_CARE_PROVIDER_SITE_OTHER): Payer: Self-pay | Admitting: General Surgery

## 2013-11-14 VITALS — BP 126/74 | HR 76 | Temp 97.0°F | Ht 70.0 in | Wt 195.2 lb

## 2013-11-14 DIAGNOSIS — K8020 Calculus of gallbladder without cholecystitis without obstruction: Secondary | ICD-10-CM

## 2013-11-14 NOTE — Patient Instructions (Signed)
Plan for laparoscopic cholecystectomy with intraoperative cholangiogram 

## 2013-11-14 NOTE — Progress Notes (Signed)
Patient ID: Timothy Lester, male   DOB: 08/21/1970, 43 y.o.   MRN: [id number]  Chief Complaint  Patient presents with  . eval gallbladder    HPI Timothy Lester is a 43 y.o. male.  We are asked to see the patient in consultation by Dr. Madilyn FiremanHayes to evaluate him for gallstones. The patient is a 43 year old white male who first developed epigastric and chest pain on Easter. The day after the pain started he developed significant nausea. He was sent to a gastroenterologist who did 2 endoscopies. Since that time his liver functions have improved and his jaundice has resolved. He continues to have some mild her discomfort in his right upper quadrant and continues to have persistent nausea. His ultrasound mostly showed sludge in the gallbladder and some dilation of his common bile duct.  HPI  Past Medical History  Diagnosis Date  . PONV (postoperative nausea and vomiting)   . Anxiety   . Depression     Past Surgical History  Procedure Laterality Date  . Fracture surgery    . Ercp N/A 10/19/2013    Procedure: ENDOSCOPIC RETROGRADE CHOLANGIOPANCREATOGRAPHY (ERCP);  Surgeon: Barrie FolkJohn C Hayes, MD;  [facility];  Service: Endoscopy;  Laterality: N/A;  . Eye surgery      Lasik  . Eus N/A 10/26/2013    Procedure: ESOPHAGEAL ENDOSCOPIC ULTRASOUND (EUS) RADIAL;  Surgeon: Willis ModenaWilliam Outlaw, MD;  [facility];  Service: Endoscopy;  Laterality: N/A;    History reviewed. No pertinent family history.  Social History History  Substance Use Topics  . Smoking status: Never Smoker   . Smokeless tobacco: Never Used  . Alcohol Use: No    Not on File  No current outpatient prescriptions on file.   No current facility-administered medications for this visit.    Review of Systems Review of Systems  Constitutional: Negative.   HENT: Negative.   Eyes: Negative.   Respiratory: Negative.   Cardiovascular: Negative.   Gastrointestinal: Positive for nausea and abdominal pain.  Endocrine:  Negative.   Genitourinary: Negative.   Musculoskeletal: Negative.   Skin: Negative.   Allergic/Immunologic: Negative.   Neurological: Negative.   Hematological: Negative.   Psychiatric/Behavioral: Negative.     Blood pressure 126/74, pulse 76, temperature 97 F (36.1 C), height 5\' 10"  (1.778 m), weight 195 lb 3.2 oz (88.542 kg).  Physical Exam Physical Exam  Constitutional: He is oriented to person, place, and time. He appears well-developed and well-nourished.  HENT:  Head: Normocephalic and atraumatic.  Eyes: Conjunctivae and EOM are normal. Pupils are equal, round, and reactive to light.  Neck: Normal range of motion. Neck supple.  Cardiovascular: Normal rate, regular rhythm and normal heart sounds.   Pulmonary/Chest: Effort normal and breath sounds normal.  Abdominal: Soft. Bowel sounds are normal.  There is mild right upper quadrant tenderness but no palpable mass.  Musculoskeletal: Normal range of motion.  Neurological: He is alert and oriented to person, place, and time.  Skin: Skin is warm and dry.  Psychiatric: He has a normal mood and affect. His behavior is normal.    Data Reviewed As above  Assessment    The patient has significant sludge in his gallbladder and may have passed a stone which Could have caused his jaundice and elevated liver functions. Because of the risk of further painful episodes and possible pancreatitis I think he would benefit from having his gallbladder removed. He would also like to have this done. I discussed with him in detail the  risks and benefits of the operation to remove the gallbladder as well as some of the technical aspects and he understands and wishes to proceed. This includes the possibility also of significant looser stools after surgery    Plan    Plan for laparoscopic cholecystectomy with intraoperative cholangiogram        Caleen Essexaul S Toth III 11/14/2013, 9:44 AM

## 2013-11-14 NOTE — Telephone Encounter (Signed)
Patient met with surgery scheduling went over financial responsible, patient will call back to schedule.

## 2015-06-28 IMAGING — CR DG ABDOMEN 1V
1 series · 2 of 2 positions shown · non-contrast
Comparison: ERCP today

CLINICAL DATA: Post ERCP.  Assess for bile duct drainage

EXAM:
ABDOMEN - 1 VIEW

[Series 1: ap (kub) · U · 2 of 2 slices shown]
[im 1/2]
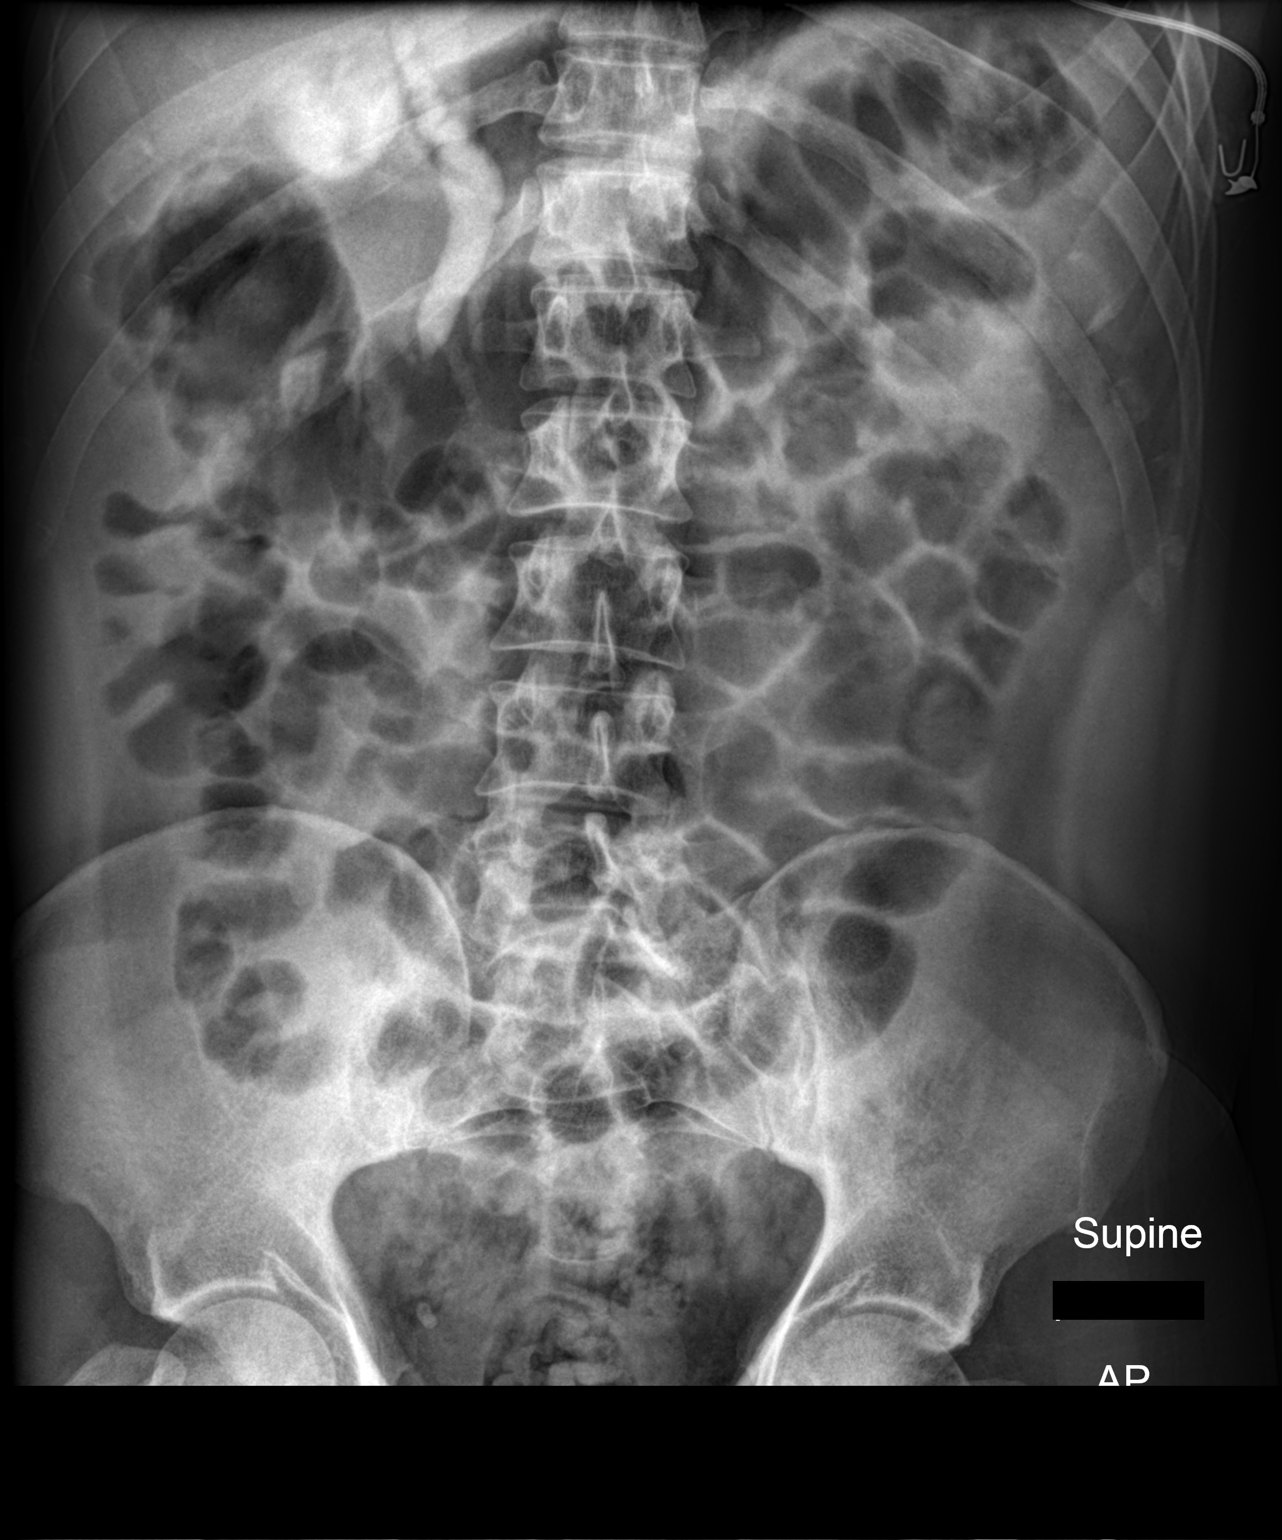
[im 2/2]
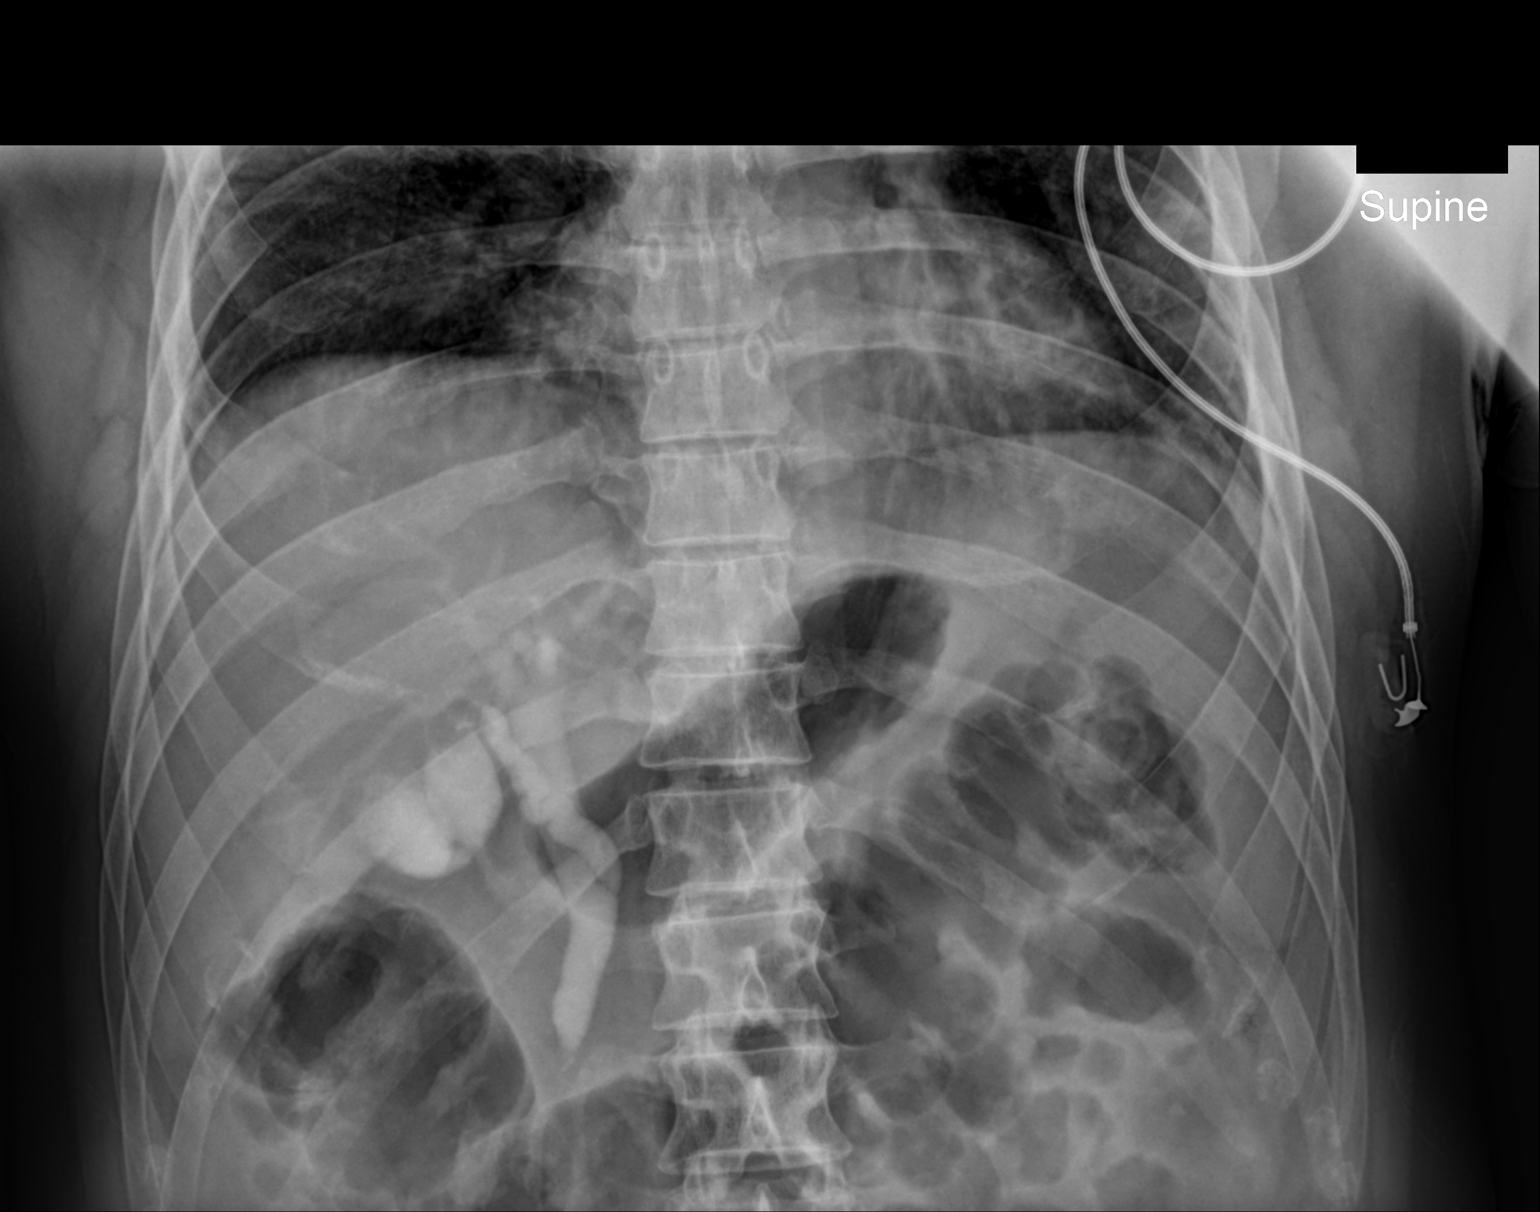

[2 of 2 positions shown; findings below may reference images not displayed]

FINDINGS: There is retained contrast in the common bile duct. There is
contrast in the cystic duct and gallbladder. Common bile duct is
upper normal in size. No filling defects.

Ileus pattern
IMPRESSION: Contrast persists in the bile ducts which may indicate distal ductal
obstruction or spasm of the sphincter.

Ileus

## 2016-03-11 DIAGNOSIS — M5136 Other intervertebral disc degeneration, lumbar region: Secondary | ICD-10-CM | POA: Diagnosis not present

## 2016-03-11 DIAGNOSIS — M62830 Muscle spasm of back: Secondary | ICD-10-CM | POA: Diagnosis not present

## 2016-03-11 DIAGNOSIS — M9903 Segmental and somatic dysfunction of lumbar region: Secondary | ICD-10-CM | POA: Diagnosis not present

## 2016-03-11 DIAGNOSIS — M545 Low back pain: Secondary | ICD-10-CM | POA: Diagnosis not present

## 2016-03-12 DIAGNOSIS — M5136 Other intervertebral disc degeneration, lumbar region: Secondary | ICD-10-CM | POA: Diagnosis not present

## 2016-03-12 DIAGNOSIS — M9903 Segmental and somatic dysfunction of lumbar region: Secondary | ICD-10-CM | POA: Diagnosis not present

## 2016-03-12 DIAGNOSIS — M545 Low back pain: Secondary | ICD-10-CM | POA: Diagnosis not present

## 2016-03-12 DIAGNOSIS — M62830 Muscle spasm of back: Secondary | ICD-10-CM | POA: Diagnosis not present

## 2016-03-17 DIAGNOSIS — M62830 Muscle spasm of back: Secondary | ICD-10-CM | POA: Diagnosis not present

## 2016-03-17 DIAGNOSIS — M5136 Other intervertebral disc degeneration, lumbar region: Secondary | ICD-10-CM | POA: Diagnosis not present

## 2016-03-17 DIAGNOSIS — M9903 Segmental and somatic dysfunction of lumbar region: Secondary | ICD-10-CM | POA: Diagnosis not present

## 2016-03-17 DIAGNOSIS — M545 Low back pain: Secondary | ICD-10-CM | POA: Diagnosis not present

## 2016-03-18 DIAGNOSIS — M9903 Segmental and somatic dysfunction of lumbar region: Secondary | ICD-10-CM | POA: Diagnosis not present

## 2016-03-18 DIAGNOSIS — M62830 Muscle spasm of back: Secondary | ICD-10-CM | POA: Diagnosis not present

## 2016-03-18 DIAGNOSIS — M545 Low back pain: Secondary | ICD-10-CM | POA: Diagnosis not present

## 2016-03-18 DIAGNOSIS — M5136 Other intervertebral disc degeneration, lumbar region: Secondary | ICD-10-CM | POA: Diagnosis not present

## 2016-03-25 DIAGNOSIS — Z0000 Encounter for general adult medical examination without abnormal findings: Secondary | ICD-10-CM | POA: Diagnosis not present

## 2016-03-25 DIAGNOSIS — Z23 Encounter for immunization: Secondary | ICD-10-CM | POA: Diagnosis not present

## 2017-04-01 DIAGNOSIS — Z0000 Encounter for general adult medical examination without abnormal findings: Secondary | ICD-10-CM | POA: Diagnosis not present

## 2017-04-01 DIAGNOSIS — R5383 Other fatigue: Secondary | ICD-10-CM | POA: Diagnosis not present

## 2017-04-01 DIAGNOSIS — Z23 Encounter for immunization: Secondary | ICD-10-CM | POA: Diagnosis not present

## 2017-08-19 DIAGNOSIS — J209 Acute bronchitis, unspecified: Secondary | ICD-10-CM | POA: Diagnosis not present

## 2018-02-21 DIAGNOSIS — H01002 Unspecified blepharitis right lower eyelid: Secondary | ICD-10-CM | POA: Diagnosis not present

## 2018-03-19 DIAGNOSIS — K3580 Unspecified acute appendicitis: Secondary | ICD-10-CM | POA: Diagnosis not present

## 2018-04-11 DIAGNOSIS — Z131 Encounter for screening for diabetes mellitus: Secondary | ICD-10-CM | POA: Diagnosis not present

## 2018-04-11 DIAGNOSIS — Z23 Encounter for immunization: Secondary | ICD-10-CM | POA: Diagnosis not present

## 2018-04-11 DIAGNOSIS — Z136 Encounter for screening for cardiovascular disorders: Secondary | ICD-10-CM | POA: Diagnosis not present

## 2018-04-11 DIAGNOSIS — Z0000 Encounter for general adult medical examination without abnormal findings: Secondary | ICD-10-CM | POA: Diagnosis not present

## 2018-08-18 DIAGNOSIS — R5383 Other fatigue: Secondary | ICD-10-CM | POA: Diagnosis not present

## 2018-08-18 DIAGNOSIS — J029 Acute pharyngitis, unspecified: Secondary | ICD-10-CM | POA: Diagnosis not present

## 2018-08-18 DIAGNOSIS — M791 Myalgia, unspecified site: Secondary | ICD-10-CM | POA: Diagnosis not present

## 2018-08-18 DIAGNOSIS — J09X2 Influenza due to identified novel influenza A virus with other respiratory manifestations: Secondary | ICD-10-CM | POA: Diagnosis not present

## 2018-08-18 DIAGNOSIS — R51 Headache: Secondary | ICD-10-CM | POA: Diagnosis not present

## 2019-01-30 DIAGNOSIS — M9903 Segmental and somatic dysfunction of lumbar region: Secondary | ICD-10-CM | POA: Diagnosis not present

## 2019-01-30 DIAGNOSIS — M5136 Other intervertebral disc degeneration, lumbar region: Secondary | ICD-10-CM | POA: Diagnosis not present

## 2019-01-30 DIAGNOSIS — M545 Low back pain: Secondary | ICD-10-CM | POA: Diagnosis not present

## 2019-01-30 DIAGNOSIS — M62830 Muscle spasm of back: Secondary | ICD-10-CM | POA: Diagnosis not present

## 2019-01-31 DIAGNOSIS — M5136 Other intervertebral disc degeneration, lumbar region: Secondary | ICD-10-CM | POA: Diagnosis not present

## 2019-01-31 DIAGNOSIS — M62830 Muscle spasm of back: Secondary | ICD-10-CM | POA: Diagnosis not present

## 2019-01-31 DIAGNOSIS — M545 Low back pain: Secondary | ICD-10-CM | POA: Diagnosis not present

## 2019-01-31 DIAGNOSIS — M9903 Segmental and somatic dysfunction of lumbar region: Secondary | ICD-10-CM | POA: Diagnosis not present

## 2019-02-02 DIAGNOSIS — M545 Low back pain: Secondary | ICD-10-CM | POA: Diagnosis not present

## 2019-02-02 DIAGNOSIS — M9903 Segmental and somatic dysfunction of lumbar region: Secondary | ICD-10-CM | POA: Diagnosis not present

## 2019-02-02 DIAGNOSIS — M5136 Other intervertebral disc degeneration, lumbar region: Secondary | ICD-10-CM | POA: Diagnosis not present

## 2019-02-02 DIAGNOSIS — M62830 Muscle spasm of back: Secondary | ICD-10-CM | POA: Diagnosis not present

## 2019-02-06 DIAGNOSIS — M9903 Segmental and somatic dysfunction of lumbar region: Secondary | ICD-10-CM | POA: Diagnosis not present

## 2019-02-06 DIAGNOSIS — M545 Low back pain: Secondary | ICD-10-CM | POA: Diagnosis not present

## 2019-02-06 DIAGNOSIS — M5136 Other intervertebral disc degeneration, lumbar region: Secondary | ICD-10-CM | POA: Diagnosis not present

## 2019-02-06 DIAGNOSIS — M62830 Muscle spasm of back: Secondary | ICD-10-CM | POA: Diagnosis not present

## 2019-02-09 DIAGNOSIS — M5136 Other intervertebral disc degeneration, lumbar region: Secondary | ICD-10-CM | POA: Diagnosis not present

## 2019-02-09 DIAGNOSIS — M545 Low back pain: Secondary | ICD-10-CM | POA: Diagnosis not present

## 2019-02-09 DIAGNOSIS — M9903 Segmental and somatic dysfunction of lumbar region: Secondary | ICD-10-CM | POA: Diagnosis not present

## 2019-02-09 DIAGNOSIS — M62830 Muscle spasm of back: Secondary | ICD-10-CM | POA: Diagnosis not present

## 2019-02-13 DIAGNOSIS — M5136 Other intervertebral disc degeneration, lumbar region: Secondary | ICD-10-CM | POA: Diagnosis not present

## 2019-02-13 DIAGNOSIS — M62830 Muscle spasm of back: Secondary | ICD-10-CM | POA: Diagnosis not present

## 2019-02-13 DIAGNOSIS — M9903 Segmental and somatic dysfunction of lumbar region: Secondary | ICD-10-CM | POA: Diagnosis not present

## 2019-02-13 DIAGNOSIS — M545 Low back pain: Secondary | ICD-10-CM | POA: Diagnosis not present

## 2019-02-16 DIAGNOSIS — M545 Low back pain: Secondary | ICD-10-CM | POA: Diagnosis not present

## 2019-02-16 DIAGNOSIS — M62830 Muscle spasm of back: Secondary | ICD-10-CM | POA: Diagnosis not present

## 2019-02-16 DIAGNOSIS — M9903 Segmental and somatic dysfunction of lumbar region: Secondary | ICD-10-CM | POA: Diagnosis not present

## 2019-02-16 DIAGNOSIS — M5136 Other intervertebral disc degeneration, lumbar region: Secondary | ICD-10-CM | POA: Diagnosis not present

## 2019-02-20 DIAGNOSIS — M545 Low back pain: Secondary | ICD-10-CM | POA: Diagnosis not present

## 2019-02-20 DIAGNOSIS — M5136 Other intervertebral disc degeneration, lumbar region: Secondary | ICD-10-CM | POA: Diagnosis not present

## 2019-02-20 DIAGNOSIS — M62830 Muscle spasm of back: Secondary | ICD-10-CM | POA: Diagnosis not present

## 2019-02-20 DIAGNOSIS — M9903 Segmental and somatic dysfunction of lumbar region: Secondary | ICD-10-CM | POA: Diagnosis not present

## 2019-02-23 DIAGNOSIS — M545 Low back pain: Secondary | ICD-10-CM | POA: Diagnosis not present

## 2019-02-23 DIAGNOSIS — M62830 Muscle spasm of back: Secondary | ICD-10-CM | POA: Diagnosis not present

## 2019-02-23 DIAGNOSIS — M5136 Other intervertebral disc degeneration, lumbar region: Secondary | ICD-10-CM | POA: Diagnosis not present

## 2019-02-23 DIAGNOSIS — M9903 Segmental and somatic dysfunction of lumbar region: Secondary | ICD-10-CM | POA: Diagnosis not present

## 2019-02-27 DIAGNOSIS — M62830 Muscle spasm of back: Secondary | ICD-10-CM | POA: Diagnosis not present

## 2019-02-27 DIAGNOSIS — M9903 Segmental and somatic dysfunction of lumbar region: Secondary | ICD-10-CM | POA: Diagnosis not present

## 2019-02-27 DIAGNOSIS — M545 Low back pain: Secondary | ICD-10-CM | POA: Diagnosis not present

## 2019-02-27 DIAGNOSIS — M5136 Other intervertebral disc degeneration, lumbar region: Secondary | ICD-10-CM | POA: Diagnosis not present

## 2019-03-02 DIAGNOSIS — M545 Low back pain: Secondary | ICD-10-CM | POA: Diagnosis not present

## 2019-03-02 DIAGNOSIS — M62830 Muscle spasm of back: Secondary | ICD-10-CM | POA: Diagnosis not present

## 2019-03-02 DIAGNOSIS — M9903 Segmental and somatic dysfunction of lumbar region: Secondary | ICD-10-CM | POA: Diagnosis not present

## 2019-03-02 DIAGNOSIS — M5136 Other intervertebral disc degeneration, lumbar region: Secondary | ICD-10-CM | POA: Diagnosis not present

## 2019-03-08 DIAGNOSIS — Z6830 Body mass index (BMI) 30.0-30.9, adult: Secondary | ICD-10-CM | POA: Diagnosis not present

## 2019-03-08 DIAGNOSIS — Z833 Family history of diabetes mellitus: Secondary | ICD-10-CM | POA: Diagnosis not present

## 2019-03-08 DIAGNOSIS — Z0000 Encounter for general adult medical examination without abnormal findings: Secondary | ICD-10-CM | POA: Diagnosis not present

## 2019-03-09 DIAGNOSIS — M545 Low back pain: Secondary | ICD-10-CM | POA: Diagnosis not present

## 2019-03-09 DIAGNOSIS — M9903 Segmental and somatic dysfunction of lumbar region: Secondary | ICD-10-CM | POA: Diagnosis not present

## 2019-03-09 DIAGNOSIS — M5136 Other intervertebral disc degeneration, lumbar region: Secondary | ICD-10-CM | POA: Diagnosis not present

## 2019-03-09 DIAGNOSIS — M62830 Muscle spasm of back: Secondary | ICD-10-CM | POA: Diagnosis not present

## 2019-03-30 DIAGNOSIS — M545 Low back pain: Secondary | ICD-10-CM | POA: Diagnosis not present

## 2019-03-30 DIAGNOSIS — M5136 Other intervertebral disc degeneration, lumbar region: Secondary | ICD-10-CM | POA: Diagnosis not present

## 2019-03-30 DIAGNOSIS — M62830 Muscle spasm of back: Secondary | ICD-10-CM | POA: Diagnosis not present

## 2019-03-30 DIAGNOSIS — M9903 Segmental and somatic dysfunction of lumbar region: Secondary | ICD-10-CM | POA: Diagnosis not present

## 2019-04-10 DIAGNOSIS — M5136 Other intervertebral disc degeneration, lumbar region: Secondary | ICD-10-CM | POA: Diagnosis not present

## 2019-04-10 DIAGNOSIS — M545 Low back pain: Secondary | ICD-10-CM | POA: Diagnosis not present

## 2019-04-10 DIAGNOSIS — M62830 Muscle spasm of back: Secondary | ICD-10-CM | POA: Diagnosis not present

## 2019-04-10 DIAGNOSIS — M9903 Segmental and somatic dysfunction of lumbar region: Secondary | ICD-10-CM | POA: Diagnosis not present

## 2019-04-13 DIAGNOSIS — M545 Low back pain: Secondary | ICD-10-CM | POA: Diagnosis not present

## 2019-04-13 DIAGNOSIS — M5136 Other intervertebral disc degeneration, lumbar region: Secondary | ICD-10-CM | POA: Diagnosis not present

## 2019-04-13 DIAGNOSIS — M9903 Segmental and somatic dysfunction of lumbar region: Secondary | ICD-10-CM | POA: Diagnosis not present

## 2019-04-13 DIAGNOSIS — M62830 Muscle spasm of back: Secondary | ICD-10-CM | POA: Diagnosis not present

## 2019-04-27 DIAGNOSIS — M545 Low back pain: Secondary | ICD-10-CM | POA: Diagnosis not present

## 2019-04-27 DIAGNOSIS — M5136 Other intervertebral disc degeneration, lumbar region: Secondary | ICD-10-CM | POA: Diagnosis not present

## 2019-04-27 DIAGNOSIS — M62830 Muscle spasm of back: Secondary | ICD-10-CM | POA: Diagnosis not present

## 2019-04-27 DIAGNOSIS — M9903 Segmental and somatic dysfunction of lumbar region: Secondary | ICD-10-CM | POA: Diagnosis not present

## 2019-05-25 DIAGNOSIS — M9903 Segmental and somatic dysfunction of lumbar region: Secondary | ICD-10-CM | POA: Diagnosis not present

## 2019-05-25 DIAGNOSIS — M545 Low back pain: Secondary | ICD-10-CM | POA: Diagnosis not present

## 2019-05-25 DIAGNOSIS — M5136 Other intervertebral disc degeneration, lumbar region: Secondary | ICD-10-CM | POA: Diagnosis not present

## 2019-05-25 DIAGNOSIS — M62830 Muscle spasm of back: Secondary | ICD-10-CM | POA: Diagnosis not present

## 2019-06-11 DIAGNOSIS — Z20828 Contact with and (suspected) exposure to other viral communicable diseases: Secondary | ICD-10-CM | POA: Diagnosis not present

## 2019-06-22 DIAGNOSIS — M5136 Other intervertebral disc degeneration, lumbar region: Secondary | ICD-10-CM | POA: Diagnosis not present

## 2019-06-22 DIAGNOSIS — M9903 Segmental and somatic dysfunction of lumbar region: Secondary | ICD-10-CM | POA: Diagnosis not present

## 2019-06-22 DIAGNOSIS — M62830 Muscle spasm of back: Secondary | ICD-10-CM | POA: Diagnosis not present

## 2019-06-22 DIAGNOSIS — M545 Low back pain: Secondary | ICD-10-CM | POA: Diagnosis not present

## 2019-08-03 DIAGNOSIS — M9903 Segmental and somatic dysfunction of lumbar region: Secondary | ICD-10-CM | POA: Diagnosis not present

## 2019-08-03 DIAGNOSIS — M62830 Muscle spasm of back: Secondary | ICD-10-CM | POA: Diagnosis not present

## 2019-08-03 DIAGNOSIS — M5136 Other intervertebral disc degeneration, lumbar region: Secondary | ICD-10-CM | POA: Diagnosis not present

## 2019-08-03 DIAGNOSIS — M545 Low back pain: Secondary | ICD-10-CM | POA: Diagnosis not present

## 2019-09-04 DIAGNOSIS — M62830 Muscle spasm of back: Secondary | ICD-10-CM | POA: Diagnosis not present

## 2019-09-04 DIAGNOSIS — M5136 Other intervertebral disc degeneration, lumbar region: Secondary | ICD-10-CM | POA: Diagnosis not present

## 2019-09-04 DIAGNOSIS — M9903 Segmental and somatic dysfunction of lumbar region: Secondary | ICD-10-CM | POA: Diagnosis not present

## 2019-09-04 DIAGNOSIS — M545 Low back pain: Secondary | ICD-10-CM | POA: Diagnosis not present

## 2019-09-05 DIAGNOSIS — M5136 Other intervertebral disc degeneration, lumbar region: Secondary | ICD-10-CM | POA: Diagnosis not present

## 2019-09-05 DIAGNOSIS — M62830 Muscle spasm of back: Secondary | ICD-10-CM | POA: Diagnosis not present

## 2019-09-05 DIAGNOSIS — M9903 Segmental and somatic dysfunction of lumbar region: Secondary | ICD-10-CM | POA: Diagnosis not present

## 2019-09-05 DIAGNOSIS — M545 Low back pain: Secondary | ICD-10-CM | POA: Diagnosis not present

## 2019-09-07 DIAGNOSIS — M62830 Muscle spasm of back: Secondary | ICD-10-CM | POA: Diagnosis not present

## 2019-09-07 DIAGNOSIS — M545 Low back pain: Secondary | ICD-10-CM | POA: Diagnosis not present

## 2019-09-07 DIAGNOSIS — M9903 Segmental and somatic dysfunction of lumbar region: Secondary | ICD-10-CM | POA: Diagnosis not present

## 2019-09-07 DIAGNOSIS — M5136 Other intervertebral disc degeneration, lumbar region: Secondary | ICD-10-CM | POA: Diagnosis not present

## 2019-09-11 DIAGNOSIS — M545 Low back pain: Secondary | ICD-10-CM | POA: Diagnosis not present

## 2019-09-11 DIAGNOSIS — M9903 Segmental and somatic dysfunction of lumbar region: Secondary | ICD-10-CM | POA: Diagnosis not present

## 2019-09-11 DIAGNOSIS — M62830 Muscle spasm of back: Secondary | ICD-10-CM | POA: Diagnosis not present

## 2019-09-11 DIAGNOSIS — M5136 Other intervertebral disc degeneration, lumbar region: Secondary | ICD-10-CM | POA: Diagnosis not present

## 2019-09-26 DIAGNOSIS — M62830 Muscle spasm of back: Secondary | ICD-10-CM | POA: Diagnosis not present

## 2019-09-26 DIAGNOSIS — M9903 Segmental and somatic dysfunction of lumbar region: Secondary | ICD-10-CM | POA: Diagnosis not present

## 2019-09-26 DIAGNOSIS — M545 Low back pain: Secondary | ICD-10-CM | POA: Diagnosis not present

## 2019-09-26 DIAGNOSIS — M5136 Other intervertebral disc degeneration, lumbar region: Secondary | ICD-10-CM | POA: Diagnosis not present

## 2019-10-05 ENCOUNTER — Ambulatory Visit: Payer: Self-pay | Attending: Internal Medicine

## 2019-10-05 DIAGNOSIS — Z23 Encounter for immunization: Secondary | ICD-10-CM

## 2019-10-05 NOTE — Progress Notes (Signed)
   Covid-19 Vaccination Clinic  Name:  AYDIEN MAJETTE    MRN: [id number] DOB: October 04, 1970  10/05/2019  Mr. Ellerby was observed post Covid-19 immunization for 15 minutes without incident. He was provided with Vaccine Information Sheet and instruction to access the V-Safe system.   Mr. Trimm was instructed to call 911 with any severe reactions post vaccine: Marland Kitchen Difficulty breathing  . Swelling of face and throat  . A fast heartbeat  . A bad rash all over body  . Dizziness and weakness   Immunizations Administered    Name Date Dose VIS Date Route   Pfizer COVID-19 Vaccine 10/05/2019 12:43 PM 0.3 mL 06/23/2019 Intramuscular   Manufacturer: [employer], [employer]   Lot: [id number]   NDC: [address]-6

## 2019-10-30 ENCOUNTER — Ambulatory Visit: Payer: Self-pay | Attending: Internal Medicine

## 2019-10-30 DIAGNOSIS — Z23 Encounter for immunization: Secondary | ICD-10-CM

## 2019-10-30 NOTE — Progress Notes (Signed)
   Covid-19 Vaccination Clinic  Name:  Timothy Lester    MRN: [id number] DOB: 1970-12-20  10/30/2019  [name] was observed post Covid-19 immunization for 15 minutes without incident. He was provided with Vaccine Information Sheet and instruction to access the V-Safe system.   [name] was instructed to call 911 with any severe reactions post vaccine: Marland Kitchen Difficulty breathing  . Swelling of face and throat  . A fast heartbeat  . A bad rash all over body  . Dizziness and weakness   Immunizations Administered    Name Date Dose VIS Date Route   Pfizer COVID-19 Vaccine 10/30/2019 10:20 AM 0.3 mL 09/06/2018 Intramuscular   Manufacturer: [employer], [employer]   Lot: [id number]   NDC: [address]-4

## 2020-03-12 DIAGNOSIS — Z13220 Encounter for screening for lipoid disorders: Secondary | ICD-10-CM | POA: Diagnosis not present

## 2020-03-12 DIAGNOSIS — Z0000 Encounter for general adult medical examination without abnormal findings: Secondary | ICD-10-CM | POA: Diagnosis not present

## 2020-03-12 DIAGNOSIS — Z125 Encounter for screening for malignant neoplasm of prostate: Secondary | ICD-10-CM | POA: Diagnosis not present

## 2020-08-21 ENCOUNTER — Other Ambulatory Visit: Payer: Self-pay | Admitting: Physician Assistant

## 2020-08-21 DIAGNOSIS — K8020 Calculus of gallbladder without cholecystitis without obstruction: Secondary | ICD-10-CM

## 2020-09-02 ENCOUNTER — Other Ambulatory Visit: Payer: Self-pay | Admitting: Physician Assistant

## 2020-09-02 DIAGNOSIS — K8020 Calculus of gallbladder without cholecystitis without obstruction: Secondary | ICD-10-CM

## 2020-09-04 ENCOUNTER — Ambulatory Visit
Admission: RE | Admit: 2020-09-04 | Discharge: 2020-09-04 | Disposition: A | Discharge status: Home / Self Care | Payer: No Typology Code available for payment source | Source: Ambulatory Visit | Attending: Physician Assistant | Admitting: Physician Assistant

## 2020-09-04 DIAGNOSIS — K8020 Calculus of gallbladder without cholecystitis without obstruction: Secondary | ICD-10-CM

## 2020-09-11 ENCOUNTER — Ambulatory Visit: Payer: Self-pay | Admitting: General Surgery

## 2020-09-11 NOTE — H&P (View-Only) (Signed)
Timothy Lester Appointment: 09/11/2020 9:00 AM Location: [facility] Patient #: [id number] DOB: [birth year] Married / Language: [place] / Race: White Male  History of Present Illness Minerva Areola M. Nelle Sayed MD; 09/11/2020 9:20 AM) The patient is a 50 year old male who presents for evaluation of gall stones. He is referred by [facility] Quentin Mulling PA for evaluation of gallbladder issues. He states his symptoms first started about 7 years ago. He was admitted for a dilated common bile duct and possible common bile duct stone and underwent ERCP and sphincterotomy there was just sludge but no obvious stone. He had that followed that with an endoscopic ultrasound which again just showed sludge. He is referred to [place] at that time but not sure what happened. He states that he continued to have intermittent gallbladder attacks which she describes as right upper quadrant pain that radiates to his back. It'll gently start with a back ache but then he'll develop right upper quadrant pain. He describes it as a toothache. He will have nausea. No fever. During the last 4-6 hours. Most recently his attacks have been increasing in frequency. There is no particular pattern to any particular types of foods at this point. He is now having episodes twice a week. He'll generally developed pain at 2 in the morning and lasts for about 6 hours. He denies any unplanned weight loss. He runs 4 miles per day. He denies any acholic stools. He does not have any recent jaundice. He states he did have jaundice back in 2015. No prior abdominal surgery. No chest pain, chest pressure, source of breath, dyspnea on exertion   Problem List/Past Medical [name], MD; 09/11/2020 9:20 AM) SYMPTOMATIC CHOLELITHIASIS (K80.20)  Diagnostic Studies History [name], MD; 09/11/2020 9:18 AM) Colonoscopy never  Allergies Marliss Coots, CNA; 09/11/2020 8:47 AM) No Known Drug Allergies [09/11/2020]: Allergies  Reconciled  Medication History Marliss Coots, CNA; 09/11/2020 8:47 AM) Ondansetron (4MG  Tablet Disint, Oral) Active. Medications Reconciled  Social History [name], MD; 09/11/2020 9:18 AM) Caffeine use Carbonated beverages. No alcohol use No drug use Tobacco use Never smoker.  Family History 11/11/2020 [name], MD; 09/11/2020 9:18 AM) Diabetes Mellitus Father. Malignant Neoplasm Of Pancreas Father. Respiratory Condition Mother.  Other Problems 11/11/2020 [name], MD; 09/11/2020 9:20 AM) Cholelithiasis     Review of Systems 11/11/2020 M. Salvadore Valvano MD; 09/11/2020 9:18 AM) General Not Present- Appetite Loss, Chills, Fatigue, Fever, Night Sweats, Weight Gain and Weight Loss. Skin Not Present- Change in Wart/Mole, Dryness, Hives, Jaundice, New Lesions, Non-Healing Wounds, Rash and Ulcer. HEENT Not Present- Earache, Hearing Loss, Hoarseness, Nose Bleed, Oral Ulcers, Ringing in the Ears, Seasonal Allergies, Sinus Pain, Sore Throat, Visual Disturbances, Wears glasses/contact lenses and Yellow Eyes. Respiratory Not Present- Bloody sputum, Chronic Cough, Difficulty Breathing, Snoring and Wheezing. Cardiovascular Not Present- Chest Pain, Difficulty Breathing Lying Down, Leg Cramps, Palpitations, Rapid Heart Rate, Shortness of Breath and Swelling of Extremities. Gastrointestinal Present- Abdominal Pain. Not Present- Bloating, Bloody Stool, Change in Bowel Habits, Chronic diarrhea, Constipation, Difficulty Swallowing, Excessive gas, Gets full quickly at meals, Hemorrhoids, Indigestion, Nausea, Rectal Pain and Vomiting. Male Genitourinary Not Present- Blood in Urine, Change in Urinary Stream, Frequency, Impotence, Nocturia, Painful Urination, Urgency and Urine Leakage. Musculoskeletal Not Present- Back Pain, Joint Pain, Joint Stiffness, Muscle Pain, Muscle Weakness and Swelling of Extremities. Neurological Not Present- Decreased Memory, Fainting, Headaches, Numbness, Seizures, Tingling, Tremor,  Trouble walking and Weakness. Psychiatric Not Present- Anxiety, Bipolar, Change in Sleep Pattern, Depression,  Fearful and Frequent crying. Endocrine Not Present- Cold Intolerance, Excessive Hunger, Hair Changes, Heat Intolerance, Hot flashes and New Diabetes. Hematology Not Present- Blood Thinners, Easy Bruising, Excessive bleeding, Gland problems, HIV and Persistent Infections.  Vitals ([name]; 09/11/2020 8:48 AM) 09/11/2020 8:47 AM Weight: 198.13 lb Height: 69.5in Body Surface Area: 2.07 m Body Mass Index: 28.84 kg/m  Temp.: 98.49F  Pulse: 89 (Regular)  P.OX: 97% (Room air) BP: 130/80(Sitting, Left Arm, Standard)        Physical Exam Minerva Areola M. Dara Beidleman MD; 09/11/2020 9:18 AM)  General Mental Status-Alert. General Appearance-Consistent with stated age. Hydration-Well hydrated. Voice-Normal.  Head and Neck Head-normocephalic, atraumatic with no lesions or palpable masses. Trachea-midline. Thyroid Gland Characteristics - normal size and consistency.  Eye Eyeball - Bilateral-Extraocular movements intact. Sclera/Conjunctiva - Bilateral-No scleral icterus.  Chest and Lung Exam Chest and lung exam reveals -quiet, even and easy respiratory effort with no use of accessory muscles and on auscultation, normal breath sounds, no adventitious sounds and normal vocal resonance. Inspection Chest Wall - Normal. Back - normal.  Breast - Did not examine.  Cardiovascular Cardiovascular examination reveals -normal heart sounds, regular rate and rhythm with no murmurs and normal pedal pulses bilaterally.  Abdomen Inspection Inspection of the abdomen reveals - No Hernias. Skin - Scar - no surgical scars. Palpation/Percussion Palpation and Percussion of the abdomen reveal - Soft, Non Tender, No Rebound tenderness, No Rigidity (guarding) and No hepatosplenomegaly. Auscultation Auscultation of the abdomen reveals - Bowel sounds normal.  Peripheral  Vascular Upper Extremity Palpation - Pulses bilaterally normal.  Neurologic Neurologic evaluation reveals -alert and oriented x 3 with no impairment of recent or remote memory. Mental Status-Normal.  Neuropsychiatric The patient's mood and affect are described as -normal. Judgment and Insight-insight is appropriate concerning matters relevant to self.  Musculoskeletal Normal Exam - Left-Upper Extremity Strength Normal and Lower Extremity Strength Normal. Normal Exam - Right-Upper Extremity Strength Normal and Lower Extremity Strength Normal.  Lymphatic Head & Neck  General Head & Neck Lymphatics: Bilateral - Description - Normal. Axillary - Did not examine. Femoral & Inguinal - Did not examine.    Assessment & Plan Minerva Areola M. Shuree Brossart MD; 09/11/2020 9:17 AM)  SYMPTOMATIC CHOLELITHIASIS (K80.20) Impression: I believe the patient's symptoms are consistent with gallbladder disease.  We discussed gallbladder disease. The patient was given Agricultural engineer. We discussed non-operative and operative management. We discussed the signs & symptoms of acute cholecystitis  I discussed laparoscopic cholecystectomy with IOC in detail. The patient was given educational material as well as diagrams detailing the procedure. We discussed the risks and benefits of a laparoscopic cholecystectomy including, but not limited to bleeding, infection, injury to surrounding structures such as the intestine or liver, bile leak, retained gallstones, need to convert to an open procedure, prolonged diarrhea, blood clots such as DVT, common bile duct injury, anesthesia risks, and possible need for additional procedures. We discussed the typical post-operative recovery course. I explained that the likelihood of improvement of their symptoms is good.  The patient has elected to proceed with surgery.  This patient encounter took 30 minutes today to perform the following: take history, perform exam, review  outside records, interpret imaging, counsel the patient on their diagnosis and document encounter, findings & plan in the EHR  Current Plans Pt Education - Pamphlet Given - Laparoscopic Gallbladder Surgery: discussed with patient and provided information. You are being scheduled for surgery- Our schedulers will call you.  You should hear from our office's scheduling department within  5 working days about the location, date, and time of surgery. We try to make accommodations for patient's preferences in scheduling surgery, but sometimes the OR schedule or the surgeon's schedule prevents [place] from making those accommodations.  If you have not heard from our office [phone]) in 5 working days, call the office and ask for your surgeon's nurse.  If you have other questions about your diagnosis, plan, or surgery, call the office and ask for your surgeon's nurse.  Mary Sella. Andrey Campanile, MD, FACS General, Bariatric, & Minimally Invasive Surgery [facility], [place]

## 2020-09-11 NOTE — H&P (Signed)
Timothy Lester Appointment: 09/11/2020 9:00 AM Location: [facility] Patient #: [id number] DOB: [birth year] Married / Language: Timothy Lester / Race: White Male  History of Present Illness Timothy Areola M. Jumar Greenstreet MD; 09/11/2020 9:20 AM) The patient is a 50 year old male who presents for evaluation of gall stones. He is referred by [facility] Lester [facility] - Timothy Mulling PA for evaluation of gallbladder issues. He states his symptoms first started about 7 years ago. He was admitted for a dilated common bile duct and possible common bile duct stone and underwent ERCP and sphincterotomy there was just sludge but no obvious stone. He had that followed that with an endoscopic ultrasound which again just showed sludge. He is referred to [place] at that time but not sure what happened. He states that he continued to have intermittent gallbladder attacks which she describes as right upper quadrant pain that radiates to his back. It'll gently start with a back ache but then he'll develop right upper quadrant pain. He describes it as a toothache. He will have nausea. No fever. During the last 4-6 hours. Most recently his attacks have been increasing in frequency. There is no particular pattern to any particular types of foods at this point. He is now having episodes twice a week. He'll generally developed pain at 2 in the morning and lasts for about 6 hours. He denies any unplanned weight loss. He runs 4 miles per day. He denies any acholic stools. He does not have any recent jaundice. He states he did have jaundice back in 2015. No prior abdominal surgery. No chest pain, chest pressure, source of breath, dyspnea on exertion   Problem List/Past Medical Timothy Areola M. Andrey Campanile, MD; 09/11/2020 9:20 AM) SYMPTOMATIC CHOLELITHIASIS (K80.20)  Diagnostic Studies History Timothy Areola M. Andrey Campanile, MD; 09/11/2020 9:18 AM) Colonoscopy never  Allergies Timothy Coots, CNA; 09/11/2020 8:47 AM) No Known Drug Allergies [09/11/2020]: Allergies  Reconciled  Medication History Timothy Coots, CNA; 09/11/2020 8:47 AM) Ondansetron (4MG  Tablet Disint, Oral) Active. Medications Reconciled  Social History [name], MD; 09/11/2020 9:18 AM) Caffeine use Carbonated beverages. No alcohol use No drug use Tobacco use Never smoker.  Family History 11/11/2020 [name], MD; 09/11/2020 9:18 AM) Diabetes Mellitus Father. Malignant Neoplasm Of Pancreas Father. Respiratory Condition Mother.  Other Problems 11/11/2020 [name], MD; 09/11/2020 9:20 AM) Cholelithiasis     Review of Systems 11/11/2020 M. [name]; 09/11/2020 9:18 AM) General Not Present- Appetite Loss, Chills, Fatigue, Fever, Night Sweats, Weight Gain and Weight Loss. Skin Not Present- Change in Wart/Mole, Dryness, Hives, Jaundice, New Lesions, Non-Healing Wounds, Rash and Ulcer. HEENT Not Present- Earache, Hearing Loss, Hoarseness, Nose Bleed, Oral Ulcers, Ringing in the Ears, Seasonal Allergies, Sinus Pain, Sore Throat, Visual Disturbances, Wears glasses/contact lenses and Yellow Eyes. Respiratory Not Present- Bloody sputum, Chronic Cough, Difficulty Breathing, Snoring and Wheezing. Cardiovascular Not Present- Chest Pain, Difficulty Breathing Lying Down, Leg Cramps, Palpitations, Rapid Heart Rate, Shortness of Breath and Swelling of Extremities. Gastrointestinal Present- Abdominal Pain. Not Present- Bloating, Bloody Stool, Change in Bowel Habits, Chronic diarrhea, Constipation, Difficulty Swallowing, Excessive gas, Gets full quickly at meals, Hemorrhoids, Indigestion, Nausea, Rectal Pain and Vomiting. Male Genitourinary Not Present- Blood in Urine, Change in Urinary Stream, Frequency, Impotence, Nocturia, Painful Urination, Urgency and Urine Leakage. Musculoskeletal Not Present- Back Pain, Joint Pain, Joint Stiffness, Muscle Pain, Muscle Weakness and Swelling of Extremities. Neurological Not Present- Decreased Memory, Fainting, Headaches, Numbness, Seizures, Tingling, Tremor,  Trouble walking and Weakness. Psychiatric Not Present- Anxiety, Bipolar, Change in Sleep Pattern, Depression,  Fearful and Frequent crying. Endocrine Not Present- Cold Intolerance, Excessive Hunger, Hair Changes, Heat Intolerance, Hot flashes and New Diabetes. Hematology Not Present- Blood Thinners, Easy Bruising, Excessive bleeding, Gland problems, HIV and Persistent Infections.  Vitals ([name]; 09/11/2020 8:48 AM) 09/11/2020 8:47 AM Weight: 198.13 lb Height: 69.5in Body Surface Area: 2.07 m Body Mass Index: 28.84 kg/m  Temp.: 98.2F  Pulse: 89 (Regular)  P.OX: 97% (Room air) BP: 130/80(Sitting, Left Arm, Standard)        Physical Exam ([name]; 09/11/2020 9:18 AM)  General Mental Status-Alert. General Appearance-Consistent with stated age. Hydration-Well hydrated. Voice-Normal.  Head and Neck Head-normocephalic, atraumatic with no lesions or palpable masses. Trachea-midline. Thyroid Gland Characteristics - normal size and consistency.  Eye Eyeball - Bilateral-Extraocular movements intact. Sclera/Conjunctiva - Bilateral-No scleral icterus.  Chest and Lung Exam Chest and lung exam reveals -quiet, even and easy respiratory effort with no use of accessory muscles and on auscultation, normal breath sounds, no adventitious sounds and normal vocal resonance. Inspection Chest Wall - Normal. Back - normal.  Breast - Did not examine.  Cardiovascular Cardiovascular examination reveals -normal heart sounds, regular rate and rhythm with no murmurs and normal pedal pulses bilaterally.  Abdomen Inspection Inspection of the abdomen reveals - No Hernias. Skin - Scar - no surgical scars. Palpation/Percussion Palpation and Percussion of the abdomen reveal - Soft, Non Tender, No Rebound tenderness, No Rigidity (guarding) and No hepatosplenomegaly. Auscultation Auscultation of the abdomen reveals - Bowel sounds normal.  Peripheral  Vascular Upper Extremity Palpation - Pulses bilaterally normal.  Neurologic Neurologic evaluation reveals -alert and oriented x 3 with no impairment of recent or remote memory. Mental Status-Normal.  Neuropsychiatric The patient's mood and affect are described as -normal. Judgment and Insight-insight is appropriate concerning matters relevant to self.  Musculoskeletal Normal Exam - Left-Upper Extremity Strength Normal and Lower Extremity Strength Normal. Normal Exam - Right-Upper Extremity Strength Normal and Lower Extremity Strength Normal.  Lymphatic Head & Neck  General Head & Neck Lymphatics: Bilateral - Description - Normal. Axillary - Did not examine. Femoral & Inguinal - Did not examine.    Assessment & Plan (Bowie Doiron M. Anu Stagner MD; 09/11/2020 9:17 AM)  SYMPTOMATIC CHOLELITHIASIS (K80.20) Impression: I believe the patient's symptoms are consistent with gallbladder disease.  We discussed gallbladder disease. The patient was given educational material. We discussed non-operative and operative management. We discussed the signs & symptoms of acute cholecystitis  I discussed laparoscopic cholecystectomy with IOC in detail. The patient was given educational material as well as diagrams detailing the procedure. We discussed the risks and benefits of a laparoscopic cholecystectomy including, but not limited to bleeding, infection, injury to surrounding structures such as the intestine or liver, bile leak, retained gallstones, need to convert to an open procedure, prolonged diarrhea, blood clots such as DVT, common bile duct injury, anesthesia risks, and possible need for additional procedures. We discussed the typical post-operative recovery course. I explained that the likelihood of improvement of their symptoms is good.  The patient has elected to proceed with surgery.  This patient encounter took 30 minutes today to perform the following: take history, perform exam, review  outside records, interpret imaging, counsel the patient on their diagnosis and document encounter, findings & plan in the EHR  Current Plans Pt Education - Pamphlet Given - Laparoscopic Gallbladder Surgery: discussed with patient and provided information. You are being scheduled for surgery- Our schedulers will call you.  You should hear from our office's scheduling department within   5 working days about the location, date, and time of surgery. We try to make accommodations for patient's preferences in scheduling surgery, but sometimes the OR schedule or the surgeon's schedule prevents [place] from making those accommodations.  If you have not heard from our office [phone]) in 5 working days, call the office and ask for your surgeon's nurse.  If you have other questions about your diagnosis, plan, or surgery, call the office and ask for your surgeon's nurse.  Mary Sella. Andrey Campanile, MD, FACS General, Bariatric, & Minimally Invasive Surgery [facility], [place]

## 2020-09-23 NOTE — Progress Notes (Signed)
DUE TO COVID-19 ONLY ONE VISITOR IS ALLOWED TO COME WITH YOU AND STAY IN THE WAITING ROOM ONLY DURING PRE OP AND PROCEDURE DAY OF SURGERY. THE 1 VISITOR  MAY VISIT WITH YOU AFTER SURGERY IN YOUR PRIVATE ROOM DURING VISITING HOURS ONLY!  YOU NEED TO HAVE A COVID 19 TEST ON_3/17/2022______ @_______ , THIS TEST MUST BE DONE BEFORE SURGERY,  COVID TESTING SITE [address] [place]  , IT IS ON THE RIGHT GOING OUT [address] APPROXIMATELY  2 MINUTES PAST [employer] ON THE RIGHT. ONCE YOUR COVID TEST IS COMPLETED,  PLEASE BEGIN THE QUARANTINE INSTRUCTIONS AS OUTLINED IN YOUR HANDOUT.                Timothy Lester  09/23/2020   Your procedure is scheduled on: 09/30/2020   Report to [facility] Main  Entrance   Report to admitting at       1145 AM     Call this number if you have problems the morning of surgery [phone]    REMEMBER: NO  SOLID FOOD CANDY OR GUM AFTER MIDNIGHT. CLEAR LIQUIDS UNTIL 1045am         . NOTHING BY MOUTH EXCEPT CLEAR LIQUIDS UNTIL1045am     . PLEASE FINISH ENSURE DRINK PER SURGEON ORDER  WHICH NEEDS TO BE COMPLETED AT  1045am   .      CLEAR LIQUID DIET   Foods Allowed                                                                    Coffee and tea, regular and decaf                            Fruit ices (not with fruit pulp)                                      Iced Popsicles                                    Carbonated beverages, regular and diet                                    Cranberry, grape and apple juices Sports drinks like Gatorade Lightly seasoned clear broth or consume(fat free) Sugar, honey syrup ___________________________________________________________________      BRUSH YOUR TEETH MORNING OF SURGERY AND RINSE YOUR MOUTH OUT, NO CHEWING GUM CANDY OR MINTS.     Take these medicines the morning of surgery with A SIP OF WATER: none   DO NOT TAKE ANY DIABETIC MEDICATIONS DAY OF YOUR SURGERY                                You may not have any metal on your body including hair pins and              piercings  Do not  wear jewelry, make-up, lotions, powders or perfumes, deodorant             Do not wear nail polish on your fingernails.  Do not shave  48 hours prior to surgery.              Men may shave face and neck.   Do not bring valuables to the hospital. [place].  Contacts, dentures or bridgework may not be worn into surgery.  Leave suitcase in the car. After surgery it may be brought to your room.     Patients discharged the day of surgery will not be allowed to drive home. IF YOU ARE HAVING SURGERY AND GOING HOME THE SAME DAY, YOU MUST HAVE AN ADULT TO DRIVE YOU HOME AND BE WITH YOU FOR 24 HOURS. YOU MAY GO HOME BY TAXI OR UBER OR ORTHERWISE, BUT AN ADULT MUST ACCOMPANY YOU HOME AND STAY WITH YOU FOR 24 HOURS.  Name and phone number of your driver:  Special Instructions: N/A              Please read over the following fact sheets you were given: _____________________________________________________________________  [facility] - Preparing for Surgery Before surgery, you can play an important role.  Because skin is not sterile, your skin needs to be as free of germs as possible.  You can reduce the number of germs on your skin by washing with CHG (chlorahexidine gluconate) soap before surgery.  CHG is an antiseptic cleaner which kills germs and bonds with the skin to continue killing germs even after washing. Please DO NOT use if you have an allergy to CHG or antibacterial soaps.  If your skin becomes reddened/irritated stop using the CHG and inform your nurse when you arrive at Short Stay. Do not shave (including legs and underarms) for at least 48 hours prior to the first CHG shower.  You may shave your face/neck. Please follow these instructions carefully:  1.  Shower with CHG Soap the night before surgery and the  morning of  Surgery.  2.  If you choose to wash your hair, wash your hair first as usual with your  normal  shampoo.  3.  After you shampoo, rinse your hair and body thoroughly to remove the  shampoo.                           4.  Use CHG as you would any other liquid soap.  You can apply chg directly  to the skin and wash                       Gently with a scrungie or clean washcloth.  5.  Apply the CHG Soap to your body ONLY FROM THE NECK DOWN.   Do not use on face/ open                           Wound or open sores. Avoid contact with eyes, ears mouth and genitals (private parts).                       Wash face,  Genitals (private parts) with your normal soap.             6.  Wash thoroughly, paying  special attention to the area where your surgery  will be performed.  7.  Thoroughly rinse your body with warm water from the neck down.  8.  DO NOT shower/wash with your normal soap after using and rinsing off  the CHG Soap.                9.  Pat yourself dry with a clean towel.            10.  Wear clean pajamas.            11.  Place clean sheets on your bed the night of your first shower and do not  sleep with pets. Day of Surgery : Do not apply any lotions/deodorants the morning of surgery.  Please wear clean clothes to the hospital/surgery center.  FAILURE TO FOLLOW THESE INSTRUCTIONS MAY RESULT IN THE CANCELLATION OF YOUR SURGERY PATIENT SIGNATURE_________________________________  NURSE SIGNATURE__________________________________  ________________________________________________________________________

## 2020-09-26 ENCOUNTER — Other Ambulatory Visit (HOSPITAL_COMMUNITY)
Admission: RE | Admit: 2020-09-26 | Discharge: 2020-09-26 | Disposition: A | Discharge status: Home / Self Care | Payer: No Typology Code available for payment source | Source: Ambulatory Visit | Attending: General Surgery | Admitting: General Surgery

## 2020-09-26 DIAGNOSIS — Z20822 Contact with and (suspected) exposure to covid-19: Secondary | ICD-10-CM | POA: Diagnosis not present

## 2020-09-26 DIAGNOSIS — Z01812 Encounter for preprocedural laboratory examination: Secondary | ICD-10-CM | POA: Diagnosis not present

## 2020-09-26 LAB — SARS CORONAVIRUS 2 (TAT 6-24 HRS): SARS Coronavirus 2: NEGATIVE

## 2020-09-27 ENCOUNTER — Encounter (HOSPITAL_COMMUNITY)
Admission: RE | Admit: 2020-09-27 | Discharge: 2020-09-27 | Disposition: A | Discharge status: Home / Self Care | Payer: No Typology Code available for payment source | Source: Ambulatory Visit | Attending: General Surgery | Admitting: General Surgery

## 2020-09-27 ENCOUNTER — Encounter (HOSPITAL_COMMUNITY): Payer: Self-pay

## 2020-09-27 ENCOUNTER — Other Ambulatory Visit: Payer: Self-pay

## 2020-09-27 DIAGNOSIS — Z01812 Encounter for preprocedural laboratory examination: Secondary | ICD-10-CM | POA: Insufficient documentation

## 2020-09-27 LAB — CBC WITH DIFFERENTIAL/PLATELET
Abs Immature Granulocytes: 0.01 10*3/uL (ref 0.00–0.07)
Basophils Absolute: 0 10*3/uL (ref 0.0–0.1)
Basophils Relative: 1 %
Eosinophils Absolute: 0 10*3/uL (ref 0.0–0.5)
Eosinophils Relative: 1 %
HCT: 42.7 % (ref 39.0–52.0)
Hemoglobin: 14.9 g/dL (ref 13.0–17.0)
Immature Granulocytes: 0 %
Lymphocytes Relative: 39 %
Lymphs Abs: 1.7 10*3/uL (ref 0.7–4.0)
MCH: 31.7 pg (ref 26.0–34.0)
MCHC: 34.9 g/dL (ref 30.0–36.0)
MCV: 90.9 fL (ref 80.0–100.0)
Monocytes Absolute: 0.4 10*3/uL (ref 0.1–1.0)
Monocytes Relative: 8 %
Neutro Abs: 2.2 10*3/uL (ref 1.7–7.7)
Neutrophils Relative %: 51 %
Platelets: 169 10*3/uL (ref 150–400)
RBC: 4.7 MIL/uL (ref 4.22–5.81)
RDW: 12 % (ref 11.5–15.5)
WBC: 4.3 10*3/uL (ref 4.0–10.5)
nRBC: 0 % (ref 0.0–0.2)

## 2020-09-27 LAB — COMPREHENSIVE METABOLIC PANEL
ALT: 26 U/L (ref 0–44)
AST: 20 U/L (ref 15–41)
Albumin: 4.2 g/dL (ref 3.5–5.0)
Alkaline Phosphatase: 58 U/L (ref 38–126)
Anion gap: 6 (ref 5–15)
BUN: 13 mg/dL (ref 6–20)
CO2: 28 mmol/L (ref 22–32)
Calcium: 9.2 mg/dL (ref 8.9–10.3)
Chloride: 104 mmol/L (ref 98–111)
Creatinine, Ser: 0.91 mg/dL (ref 0.61–1.24)
GFR, Estimated: >60 mL/min (ref >60)
Glucose, Bld: 102 mg/dL — ABNORMAL HIGH (ref 70–99)
Potassium: 4.4 mmol/L (ref 3.5–5.1)
Sodium: 138 mmol/L (ref 135–145)
Total Bilirubin: 0.9 mg/dL (ref 0.3–1.2)
Total Protein: 7.2 g/dL (ref 6.5–8.1)

## 2020-09-30 ENCOUNTER — Encounter (HOSPITAL_COMMUNITY): Payer: Self-pay | Admitting: General Surgery

## 2020-09-30 ENCOUNTER — Ambulatory Visit (HOSPITAL_COMMUNITY): Payer: No Typology Code available for payment source | Admitting: Certified Registered Nurse Anesthetist

## 2020-09-30 ENCOUNTER — Encounter (HOSPITAL_COMMUNITY)
Admission: RE | Disposition: A | Discharge status: Home / Self Care | Payer: Self-pay | Source: Home / Self Care | Attending: General Surgery

## 2020-09-30 ENCOUNTER — Observation Stay (HOSPITAL_COMMUNITY)
Admission: RE | Admit: 2020-09-30 | Discharge: 2020-10-01 | Disposition: A | Discharge status: Home / Self Care | Payer: No Typology Code available for payment source | Attending: General Surgery | Admitting: General Surgery

## 2020-09-30 ENCOUNTER — Ambulatory Visit (HOSPITAL_COMMUNITY): Payer: No Typology Code available for payment source

## 2020-09-30 ENCOUNTER — Other Ambulatory Visit: Payer: Self-pay

## 2020-09-30 DIAGNOSIS — Z419 Encounter for procedure for purposes other than remedying health state, unspecified: Secondary | ICD-10-CM

## 2020-09-30 DIAGNOSIS — K8020 Calculus of gallbladder without cholecystitis without obstruction: Secondary | ICD-10-CM | POA: Diagnosis present

## 2020-09-30 DIAGNOSIS — K8010 Calculus of gallbladder with chronic cholecystitis without obstruction: Secondary | ICD-10-CM | POA: Diagnosis not present

## 2020-09-30 DIAGNOSIS — Z9049 Acquired absence of other specified parts of digestive tract: Secondary | ICD-10-CM

## 2020-09-30 HISTORY — PX: CHOLECYSTECTOMY: SHX55

## 2020-09-30 SURGERY — LAPAROSCOPIC CHOLECYSTECTOMY WITH INTRAOPERATIVE CHOLANGIOGRAM
Anesthesia: General

## 2020-09-30 MED ORDER — ACETAMINOPHEN 500 MG PO TABS
1000.0000 mg | ORAL_TABLET | ORAL | Status: AC
  Administered 2020-09-30: 1000 mg via ORAL
  Filled 2020-09-30: qty 2

## 2020-09-30 MED ORDER — FENTANYL CITRATE (PF) 100 MCG/2ML IJ SOLN
INTRAMUSCULAR | Status: DC | PRN
  Administered 2020-09-30 (×2): 50 ug via INTRAVENOUS

## 2020-09-30 MED ORDER — LACTATED RINGERS IV SOLN: INTRAVENOUS | Status: DC | PRN

## 2020-09-30 MED ORDER — DIPHENHYDRAMINE HCL 50 MG/ML IJ SOLN: 12.5000 mg | Freq: Four times a day (QID) | INTRAMUSCULAR | Status: DC | PRN

## 2020-09-30 MED ORDER — LIDOCAINE 2% (20 MG/ML) 5 ML SYRINGE
INTRAMUSCULAR | Status: DC | PRN
  Administered 2020-09-30: 60 mg via INTRAVENOUS

## 2020-09-30 MED ORDER — FENTANYL CITRATE (PF) 100 MCG/2ML IJ SOLN
25.0000 ug | INTRAMUSCULAR | Status: DC | PRN
  Administered 2020-09-30 (×2): 50 ug via INTRAVENOUS

## 2020-09-30 MED ORDER — SODIUM CHLORIDE 0.9 % IV SOLN
2.0000 g | INTRAVENOUS | Status: AC
  Administered 2020-09-30: 2 g via INTRAVENOUS
  Filled 2020-09-30: qty 2

## 2020-09-30 MED ORDER — SIMETHICONE 80 MG PO CHEW
40.0000 mg | CHEWABLE_TABLET | Freq: Four times a day (QID) | ORAL | Status: DC | PRN
  Administered 2020-10-01: 40 mg via ORAL
  Filled 2020-09-30: qty 1

## 2020-09-30 MED ORDER — ROCURONIUM BROMIDE 10 MG/ML (PF) SYRINGE
PREFILLED_SYRINGE | INTRAVENOUS | Status: DC | PRN
  Administered 2020-09-30: 60 mg via INTRAVENOUS
  Administered 2020-09-30: 20 mg via INTRAVENOUS

## 2020-09-30 MED ORDER — FENTANYL CITRATE (PF) 100 MCG/2ML IJ SOLN
INTRAMUSCULAR | Status: AC
  Filled 2020-09-30: qty 2

## 2020-09-30 MED ORDER — ONDANSETRON 4 MG PO TBDP
4.0000 mg | ORAL_TABLET | Freq: Four times a day (QID) | ORAL | Status: DC | PRN
  Administered 2020-10-01: 4 mg via ORAL
  Filled 2020-09-30: qty 1

## 2020-09-30 MED ORDER — ENOXAPARIN SODIUM 40 MG/0.4ML ~~LOC~~ SOLN
40.0000 mg | SUBCUTANEOUS | Status: DC
  Administered 2020-10-01: 40 mg via SUBCUTANEOUS
  Filled 2020-09-30: qty 0.4

## 2020-09-30 MED ORDER — BUPIVACAINE-EPINEPHRINE (PF) 0.25% -1:200000 IJ SOLN
INTRAMUSCULAR | Status: AC
  Filled 2020-09-30: qty 30

## 2020-09-30 MED ORDER — SUGAMMADEX SODIUM 200 MG/2ML IV SOLN
INTRAVENOUS | Status: DC | PRN
  Administered 2020-09-30: 400 mg via INTRAVENOUS

## 2020-09-30 MED ORDER — LABETALOL HCL 5 MG/ML IV SOLN
INTRAVENOUS | Status: DC | PRN
  Administered 2020-09-30: 5 mg via INTRAVENOUS

## 2020-09-30 MED ORDER — ONDANSETRON HCL 4 MG/2ML IJ SOLN
4.0000 mg | Freq: Four times a day (QID) | INTRAMUSCULAR | Status: DC | PRN
  Administered 2020-09-30: 4 mg via INTRAVENOUS
  Filled 2020-09-30: qty 2

## 2020-09-30 MED ORDER — MIDAZOLAM HCL 2 MG/2ML IJ SOLN
INTRAMUSCULAR | Status: AC
  Filled 2020-09-30: qty 2

## 2020-09-30 MED ORDER — METHOCARBAMOL 500 MG PO TABS
500.0000 mg | ORAL_TABLET | Freq: Four times a day (QID) | ORAL | Status: DC | PRN
  Administered 2020-10-01: 500 mg via ORAL
  Filled 2020-09-30: qty 1

## 2020-09-30 MED ORDER — ENSURE PRE-SURGERY PO LIQD
296.0000 mL | Freq: Once | ORAL | Status: DC
  Filled 2020-09-30: qty 296

## 2020-09-30 MED ORDER — LACTATED RINGERS IR SOLN
Status: DC | PRN
  Administered 2020-09-30: 3000 mL

## 2020-09-30 MED ORDER — BUPIVACAINE-EPINEPHRINE 0.25% -1:200000 IJ SOLN
INTRAMUSCULAR | Status: DC | PRN
  Administered 2020-09-30: 20 mL

## 2020-09-30 MED ORDER — PANTOPRAZOLE SODIUM 40 MG IV SOLR
40.0000 mg | Freq: Every day | INTRAVENOUS | Status: DC
  Administered 2020-09-30: 40 mg via INTRAVENOUS
  Filled 2020-09-30: qty 40

## 2020-09-30 MED ORDER — 0.9 % SODIUM CHLORIDE (POUR BTL) OPTIME
TOPICAL | Status: DC | PRN
  Administered 2020-09-30: 1000 mL

## 2020-09-30 MED ORDER — MORPHINE SULFATE (PF) 2 MG/ML IV SOLN
1.0000 mg | INTRAVENOUS | Status: DC | PRN
  Administered 2020-09-30: 2 mg via INTRAVENOUS
  Filled 2020-09-30: qty 1

## 2020-09-30 MED ORDER — OXYCODONE HCL 5 MG PO TABS
5.0000 mg | ORAL_TABLET | ORAL | Status: DC | PRN
  Administered 2020-10-01: 5 mg via ORAL
  Filled 2020-09-30 (×2): qty 1

## 2020-09-30 MED ORDER — IOHEXOL 300 MG/ML  SOLN
INTRAMUSCULAR | Status: DC | PRN
  Administered 2020-09-30: 22 mL

## 2020-09-30 MED ORDER — PROPOFOL 10 MG/ML IV BOLUS
INTRAVENOUS | Status: DC | PRN
  Administered 2020-09-30: 200 mg via INTRAVENOUS

## 2020-09-30 MED ORDER — CHLORHEXIDINE GLUCONATE CLOTH 2 % EX PADS: 6.0000 | MEDICATED_PAD | Freq: Once | CUTANEOUS | Status: DC

## 2020-09-30 MED ORDER — ACETAMINOPHEN 500 MG PO TABS
1000.0000 mg | ORAL_TABLET | Freq: Three times a day (TID) | ORAL | Status: DC
  Administered 2020-09-30 – 2020-10-01 (×2): 1000 mg via ORAL
  Filled 2020-09-30 (×2): qty 2

## 2020-09-30 MED ORDER — SCOPOLAMINE 1 MG/3DAYS TD PT72
1.0000 | MEDICATED_PATCH | TRANSDERMAL | Status: DC
  Administered 2020-09-30: 1.5 mg via TRANSDERMAL
  Filled 2020-09-30: qty 1

## 2020-09-30 MED ORDER — DIPHENHYDRAMINE HCL 12.5 MG/5ML PO ELIX: 12.5000 mg | ORAL_SOLUTION | Freq: Four times a day (QID) | ORAL | Status: DC | PRN

## 2020-09-30 MED ORDER — MIDAZOLAM HCL 5 MG/5ML IJ SOLN
INTRAMUSCULAR | Status: DC | PRN
  Administered 2020-09-30: 2 mg via INTRAVENOUS

## 2020-09-30 MED ORDER — AMISULPRIDE (ANTIEMETIC) 5 MG/2ML IV SOLN: 10.0000 mg | Freq: Once | INTRAVENOUS | Status: DC | PRN

## 2020-09-30 MED ORDER — DEXAMETHASONE SODIUM PHOSPHATE 10 MG/ML IJ SOLN
INTRAMUSCULAR | Status: DC | PRN
  Administered 2020-09-30: 8 mg via INTRAVENOUS

## 2020-09-30 MED ORDER — PROMETHAZINE HCL 25 MG/ML IJ SOLN: 6.2500 mg | INTRAMUSCULAR | Status: DC | PRN

## 2020-09-30 MED ORDER — KCL IN DEXTROSE-NACL 20-5-0.45 MEQ/L-%-% IV SOLN
INTRAVENOUS | Status: DC
  Filled 2020-09-30 (×2): qty 1000

## 2020-09-30 MED ORDER — KETOROLAC TROMETHAMINE 30 MG/ML IJ SOLN
INTRAMUSCULAR | Status: AC
  Filled 2020-09-30: qty 1

## 2020-09-30 MED ORDER — DOCUSATE SODIUM 100 MG PO CAPS
100.0000 mg | ORAL_CAPSULE | Freq: Two times a day (BID) | ORAL | Status: DC
  Administered 2020-09-30 – 2020-10-01 (×2): 100 mg via ORAL
  Filled 2020-09-30 (×2): qty 1

## 2020-09-30 MED ORDER — HYDROMORPHONE HCL 1 MG/ML IJ SOLN
INTRAMUSCULAR | Status: DC | PRN
  Administered 2020-09-30: .5 mg via INTRAVENOUS
  Administered 2020-09-30: 1 mg via INTRAVENOUS
  Administered 2020-09-30: .5 mg via INTRAVENOUS

## 2020-09-30 MED ORDER — OXYCODONE HCL 5 MG PO TABS: 5.0000 mg | ORAL_TABLET | Freq: Once | ORAL | Status: DC | PRN

## 2020-09-30 MED ORDER — ONDANSETRON HCL 4 MG/2ML IJ SOLN
INTRAMUSCULAR | Status: DC | PRN
  Administered 2020-09-30: 4 mg via INTRAVENOUS

## 2020-09-30 MED ORDER — KETOROLAC TROMETHAMINE 30 MG/ML IJ SOLN
30.0000 mg | Freq: Once | INTRAMUSCULAR | Status: AC | PRN
  Administered 2020-09-30: 30 mg via INTRAVENOUS

## 2020-09-30 MED ORDER — OXYCODONE HCL 5 MG/5ML PO SOLN: 5.0000 mg | Freq: Once | ORAL | Status: DC | PRN

## 2020-09-30 SURGICAL SUPPLY — 53 items
APPLIER CLIP 5 13 M/L LIGAMAX5 (MISCELLANEOUS) ×2
BLADE CLIPPER SURG (BLADE) IMPLANT
CANISTER SUCT 3000ML PPV (MISCELLANEOUS) ×2 IMPLANT
CATH REDDICK CHOLANGI 4FR 50CM (CATHETERS) ×2 IMPLANT
CHLORAPREP W/TINT 26 (MISCELLANEOUS) ×2 IMPLANT
CLIP APPLIE 5 13 M/L LIGAMAX5 (MISCELLANEOUS) ×1 IMPLANT
CLSR STERI-STRIP ANTIMIC 1/2X4 (GAUZE/BANDAGES/DRESSINGS) ×2 IMPLANT
COVER MAYO STAND STRL (DRAPES) ×2 IMPLANT
COVER SURGICAL LIGHT HANDLE (MISCELLANEOUS) ×2 IMPLANT
COVER WAND RF STERILE (DRAPES) ×2 IMPLANT
DRAIN CHANNEL 19F RND (DRAIN) ×2 IMPLANT
DRAPE C-ARM 42X120 X-RAY (DRAPES) ×2 IMPLANT
DRSG TEGADERM 2-3/8X2-3/4 SM (GAUZE/BANDAGES/DRESSINGS) ×2 IMPLANT
DRSG TEGADERM 4X4.75 (GAUZE/BANDAGES/DRESSINGS) IMPLANT
ELECT REM PT RETURN 15FT ADLT (MISCELLANEOUS) ×4 IMPLANT
ENDOLOOP SUT PDS II  0 18 (SUTURE) ×8
ENDOLOOP SUT PDS II 0 18 (SUTURE) ×4 IMPLANT
EVACUATOR SILICONE 100CC (DRAIN) ×2 IMPLANT
GAUZE SPONGE 2X2 8PLY STRL LF (GAUZE/BANDAGES/DRESSINGS) ×1 IMPLANT
GLOVE SURG ENC TEXT LTX SZ7.5 (GLOVE) ×2 IMPLANT
GLOVE SURG UNDER LTX SZ8 (GLOVE) ×4 IMPLANT
GOWN STRL REUS W/ TWL LRG LVL3 (GOWN DISPOSABLE) ×3 IMPLANT
GOWN STRL REUS W/TWL 2XL LVL3 (GOWN DISPOSABLE) ×2 IMPLANT
GOWN STRL REUS W/TWL LRG LVL3 (GOWN DISPOSABLE) ×6
GRASPER SUT TROCAR 14GX15 (MISCELLANEOUS) ×4 IMPLANT
IV CATH 14GX2 1/4 (CATHETERS) ×2 IMPLANT
KIT BASIN OR (CUSTOM PROCEDURE TRAY) ×2 IMPLANT
KIT TURNOVER KIT A (KITS) ×2 IMPLANT
L-HOOK LAP DISP 36CM (ELECTROSURGICAL) ×2
LHOOK LAP DISP 36CM (ELECTROSURGICAL) ×1 IMPLANT
NS IRRIG 1000ML POUR BTL (IV SOLUTION) ×2 IMPLANT
PAD ARMBOARD 7.5X6 YLW CONV (MISCELLANEOUS) ×2 IMPLANT
POUCH RETRIEVAL ECOSAC 10 (ENDOMECHANICALS) ×1 IMPLANT
POUCH RETRIEVAL ECOSAC 10MM (ENDOMECHANICALS) ×2
SCISSORS LAP 5X35 DISP (ENDOMECHANICALS) ×2 IMPLANT
SET CHOLANGIOGRAPH MIX (MISCELLANEOUS) ×2 IMPLANT
SET IRRIG TUBING LAPAROSCOPIC (IRRIGATION / IRRIGATOR) ×2 IMPLANT
SET TUBE SMOKE EVAC HIGH FLOW (TUBING) ×2 IMPLANT
SLEEVE ENDOPATH XCEL 5M (ENDOMECHANICALS) ×4 IMPLANT
SPONGE GAUZE 2X2 8PLY STRL LF (GAUZE/BANDAGES/DRESSINGS) ×2 IMPLANT
SPONGE GAUZE 2X2 STER 10/PKG (GAUZE/BANDAGES/DRESSINGS) ×1
STOPCOCK 4 WAY LG BORE MALE ST (IV SETS) ×2 IMPLANT
STRIP CLOSURE SKIN 1/2X4 (GAUZE/BANDAGES/DRESSINGS) ×2 IMPLANT
SUT ETHILON 2 0 PS N (SUTURE) ×2 IMPLANT
SUT MNCRL AB 4-0 PS2 18 (SUTURE) ×2 IMPLANT
SUT VIC AB 0 UR5 27 (SUTURE) ×2 IMPLANT
SUT VICRYL 0 UR6 27IN ABS (SUTURE) ×2 IMPLANT
TOWEL OR 17X26 10 PK STRL BLUE (TOWEL DISPOSABLE) ×2 IMPLANT
TOWEL OR NON WOVEN STRL DISP B (DISPOSABLE) ×2 IMPLANT
TRAY LAPAROSCOPIC (CUSTOM PROCEDURE TRAY) ×2 IMPLANT
TROCAR XCEL BLUNT TIP 100MML (ENDOMECHANICALS) ×2 IMPLANT
TROCAR XCEL NON-BLD 5MMX100MML (ENDOMECHANICALS) ×2 IMPLANT
WATER STERILE IRR 1000ML POUR (IV SOLUTION) ×2 IMPLANT

## 2020-09-30 NOTE — Anesthesia Preprocedure Evaluation (Addendum)
Anesthesia Evaluation  Patient identified by MRN, date of birth, ID band Patient awake    Reviewed: Allergy & Precautions, NPO status , Patient's Chart, lab work & pertinent test results  History of Anesthesia Complications (+) PONV and history of anesthetic complications  Airway Mallampati: III  TM Distance: >3 FB Neck ROM: Full    Dental no notable dental hx.    Pulmonary neg pulmonary ROS,    Pulmonary exam normal breath sounds clear to auscultation       Cardiovascular negative cardio ROS Normal cardiovascular exam Rhythm:Regular Rate:Normal     Neuro/Psych PSYCHIATRIC DISORDERS Anxiety Depression negative neurological ROS     GI/Hepatic negative GI ROS, Neg liver ROS,   Endo/Other  negative endocrine ROS  Renal/GU negative Renal ROS     Musculoskeletal negative musculoskeletal ROS (+)   Abdominal   Peds  Hematology negative hematology ROS (+)   Anesthesia Other Findings SYMPTOMATIC CHOLELITHIASIS  Reproductive/Obstetrics                           Anesthesia Physical Anesthesia Plan  ASA: II  Anesthesia Plan: General   Post-op Pain Management:    Induction: Intravenous  PONV Risk Score and Plan: 3 and Ondansetron, Dexamethasone, Midazolam, Treatment may vary due to age or medical condition, Amisulpride, Propofol infusion and Scopolamine patch - Pre-op  Airway Management Planned: Oral ETT  Additional Equipment:   Intra-op Plan:   Post-operative Plan: Extubation in OR  Informed Consent: I have reviewed the patients History and Physical, chart, labs and discussed the procedure including the risks, benefits and alternatives for the proposed anesthesia with the patient or authorized representative who has indicated his/her understanding and acceptance.     Dental advisory given  Plan Discussed with: CRNA  Anesthesia Plan Comments:        Anesthesia Quick  Evaluation

## 2020-09-30 NOTE — Transfer of Care (Signed)
Immediate Anesthesia Transfer of Care Note  Patient: Timothy Lester  Procedure(s) Performed: LAPAROSCOPIC CHOLECYSTECTOMY WITH  INTRAOPERATIVE CHOLANGIOGRAM (N/A )  Patient Location: PACU  Anesthesia Type:General  Level of Consciousness: awake, alert  and oriented  Airway & Oxygen Therapy: Patient Spontanous Breathing and Patient connected to face mask  Post-op Assessment: Report given to RN and Post -op Vital signs reviewed and stable  Post vital signs: Reviewed and stable  Last Vitals:  Vitals Value Taken Time  BP 146/95 09/30/20 1630  Temp 36.5 C 09/30/20 1628  Pulse 80 09/30/20 1635  Resp 9 09/30/20 1635  SpO2 98 % 09/30/20 1635  Vitals shown include unvalidated device data.  Last Pain:  Vitals:   09/30/20 1630  TempSrc:   PainSc: Asleep         Complications: No complications documented.

## 2020-09-30 NOTE — Anesthesia Procedure Notes (Signed)
Procedure Name: Intubation Performed by: Rogelio Waynick A, CRNA Pre-anesthesia Checklist: Patient identified, Emergency Drugs available, Suction available and Patient being monitored Patient Re-evaluated:Patient Re-evaluated prior to induction Oxygen Delivery Method: Circle system utilized Preoxygenation: Pre-oxygenation with 100% oxygen Induction Type: IV induction Ventilation: Mask ventilation without difficulty Laryngoscope Size: Miller and 2 Grade View: Grade I Tube type: Oral Tube size: 7.5 mm Number of attempts: 1 Airway Equipment and Method: Stylet Placement Confirmation: ETT inserted through vocal cords under direct vision,  positive ETCO2 and breath sounds checked- equal and bilateral Secured at: 22 cm Tube secured with: Tape Dental Injury: Teeth and Oropharynx as per pre-operative assessment        

## 2020-09-30 NOTE — Op Note (Signed)
Timothy Lester [id number] June 22, 1971 09/30/2020  Laparoscopic Cholecystectomy with attempted IOC Procedure Note  Indications: This patient presents with symptomatic gallbladder disease and will undergo laparoscopic cholecystectomy.He has been having intermittent gallbladder episodes for many years. He underwent ERCP in 2015. His symptoms have been becoming more frequent so he opted for surgical intervention.   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 The risks, benefits, complications, treatment options, and expected outcomes were discussed with the patient. The possibilities of reaction to medication, pulmonary aspiration, perforation of viscus, bleeding, recurrent infection, finding a normal gallbladder, the need for additional procedures, failure to diagnose a condition, the possible need to convert to an open procedure, and creating a complication requiring transfusion or operation were discussed with the patient. The likelihood of improving the patient's symptoms with return to their baseline status is good.  The patient and/or family concurred with the proposed plan, giving informed consent. The site of surgery properly noted. The patient was taken to Operating Room, identified as Timothy Lester and the procedure verified as Laparoscopic Cholecystectomy with Intraoperative Cholangiogram. A Time Out was held and the above information confirmed. Antibiotic prophylaxis was administered.   Prior to the induction of general anesthesia, antibiotic prophylaxis was administered. General endotracheal anesthesia was then administered and tolerated well. After the induction, the abdomen was prepped with Chloraprep and draped in the sterile fashion. The patient was positioned in the  supine position.  Local anesthetic agent was injected into the skin near the umbilicus and an incision made. We dissected down to the abdominal fascia with blunt dissection.  The fascia was incised vertically and we entered the peritoneal cavity bluntly.  A pursestring suture of 0-Vicryl was placed around the fascial opening.  The Hasson cannula was inserted and secured with the stay suture.  Pneumoperitoneum was then created with CO2 and tolerated well without any adverse changes in the patient's vital signs. An 5-mm port was placed in the subxiphoid position.  Two 5-mm ports were placed in the right upper quadrant. All skin incisions were infiltrated with a local anesthetic agent before making the incision and placing the trocars.   We positioned the patient in reverse Trendelenburg, tilted slightly to the patient's left.  The gallbladder was identified, the fundus grasped and retracted cephalad. Omental Adhesions were lysed bluntly and with the electrocautery where indicated away from the gallbladder, taking care not to injure any adjacent organs or viscus. The infundibulum was grasped and retracted laterally, exposing the peritoneum overlying the triangle of Calot. This was then divided and exposed in a blunt fashion. The patient has a very dilated cystic duct. A critical view of the cystic duct and cystic artery was obtained. The cystic artery was running right on top of the cystic duct. I mobilized the gallbladder laterally and medially with hook cautery. I confirmed no other structures were entering the gallbladder. In mobilizing the gallbladder medially off of the liver the gallbladder was entered with some spillage of dark thick green bile. The cystic duct was clearly identified and bluntly dissected circumferentially.  Since no other structures were entering the gallbladder and the fact that the artery was laying on top of the cystic duct, I ligated the cystic artery with 2 clips proximally and  cauterized it distally on the gallbladder.   The cystic duct was dilated all  the way up to the infundibulum and too dilated to accommodate a clip. Therefore I made a small defect in the infundibulum with cautery and placed a balloon tipped cholangiogram catheter thru the abdominal wall thru the defect and inflated the balloon.  A cholangiogram was then attempted. Unfortunately, contrast did not drain out of the gallbladder. It refluxed into the gallbladder and there was no opacification of the common bile duct or duodenum. At this point, I decided to make any additional attempts. His preop LFTs were normal. While his preop u/s showed a cbd diameter of 74mm, there was no evidence of cbd stone(s).. the balloon was deflated and catheter was removed. As mentioned, the cystic duct was too dilated to accommodate a clip. So I transected the gallbladder at the infundibulum with hook cautery.  A PDS endoloop was placed on the infundibulum to prevent spillage of bile and stones. We placed 2 PDS endoloops on the cystic duct stump. .    The gallbladder was dissected from the liver bed in retrograde fashion with the electrocautery. The gallbladder was removed and placed in an Ecco sac.  The gallbladder and Ecco sac were then removed through the umbilical port site. The liver bed was irrigated and inspected. Hemostasis was achieved with the electrocautery. I placed a 19 fr round blake drain into the abdomen and brought it out thru the lateral RUQ trocar site and secured with a 2-0 nylon. The drain was placed in the RUQ/gallbladder fossa.  Copious irrigation was utilized and was repeatedly aspirated until clear.  The pursestring suture was used to close the umbilical fascia.    We again inspected the right upper quadrant for hemostasis. I did place an additional interrupted 0 vicryl suture with PMI suture passer with lap guidance at the umbilicus.  The umbilical closure was inspected and there was no air leak and nothing  trapped within the closure. Pneumoperitoneum was released as we removed the trocars.  4-0 Monocryl was used to close the skin.    steri-strips, and clean dressings were applied. The patient was then extubated and brought to the recovery room in stable condition. Instrument, sponge, and needle counts were correct at closure and at the conclusion of the case.   Findings: Chronic Cholecystitis with Cholelithiasis +critical view Large dilated cystic duct. PDS endoloops around cystic duct stump  Estimated Blood Loss: 50         Drains:19 french round         Specimens: Gallbladder           Complications: none         Disposition: overnight observ to monitor to          Condition: stable  Mary Sella. Andrey Campanile, MD, FACS General, Bariatric, & Minimally Invasive Surgery [facility], [place]

## 2020-09-30 NOTE — Interval H&P Note (Signed)
History and Physical Interval Note:  09/30/2020 2:15 PM  Timothy Lester  has presented today for surgery, with the diagnosis of SYMPTOMATIC CHOLELITHIASIS.  The various methods of treatment have been discussed with the patient and family. After consideration of risks, benefits and other options for treatment, the patient has consented to  Procedure(s) with comments: LAPAROSCOPIC CHOLECYSTECTOMY WITH POSSIBLE INTRAOPERATIVE CHOLANGIOGRAM (N/A) - 90 MIN as a surgical intervention.  The patient's history has been reviewed, patient examined, no change in status, stable for surgery.  I have reviewed the patient's chart and labs.  Questions were answered to the patient's satisfaction.     [name]

## 2020-09-30 NOTE — Brief Op Note (Signed)
09/30/2020  4:13 PM  PATIENT:  Timothy Lester  50 y.o. male  PRE-OPERATIVE DIAGNOSIS:  SYMPTOMATIC CHOLELITHIASIS  POST-OPERATIVE DIAGNOSIS:  SYMPTOMATIC CHOLELITHIASIS  PROCEDURE:  Procedure(s) with comments: LAPAROSCOPIC CHOLECYSTECTOMY WITH  INTRAOPERATIVE CHOLANGIOGRAM (N/A) -   SURGEON:  Surgeon(s) and Role:    [name], MD - Primary  PHYSICIAN ASSISTANT:   ASSISTANTS: none   ANESTHESIA:   general  EBL:  50 mL   BLOOD ADMINISTERED:none  DRAINS: (87fr) Jackson-Pratt drain(s) with closed bulb suction in the RUQ   LOCAL MEDICATIONS USED:  MARCAINE     SPECIMEN:  Source of Specimen:  gallbladder  DISPOSITION OF SPECIMEN:  PATHOLOGY  COUNTS:  YES  TOURNIQUET:  * No tourniquets in log *  DICTATION: .Dragon Dictation and Other Dictation: Dictation Number  PLAN OF CARE: Admit for overnight observation  PATIENT DISPOSITION:  PACU - hemodynamically stable.   Delay start of Pharmacological VTE agent (>24hrs) due to surgical blood loss or risk of bleeding: no

## 2020-09-30 NOTE — Anesthesia Postprocedure Evaluation (Signed)
Anesthesia Post Note  Patient: Timothy Lester  Procedure(s) Performed: LAPAROSCOPIC CHOLECYSTECTOMY WITH  INTRAOPERATIVE CHOLANGIOGRAM (N/A )     Patient location during evaluation: PACU Anesthesia Type: General Level of consciousness: awake Pain management: pain level controlled Vital Signs Assessment: post-procedure vital signs reviewed and stable Respiratory status: spontaneous breathing, nonlabored ventilation, respiratory function stable and patient connected to nasal cannula oxygen Cardiovascular status: blood pressure returned to baseline and stable Postop Assessment: no apparent nausea or vomiting Anesthetic complications: no   No complications documented.  Last Vitals:  Vitals:   09/30/20 1730 09/30/20 1754  BP: 137/83 134/87  Pulse: 78 92  Resp: (!) 8 14  Temp:  36.6 C  SpO2: 97% 100%    Last Pain:  Vitals:   09/30/20 1800  TempSrc:   PainSc: 6                  Brenson Hartman P Elieser Tetrick

## 2020-10-01 ENCOUNTER — Encounter (HOSPITAL_COMMUNITY): Payer: Self-pay | Admitting: General Surgery

## 2020-10-01 DIAGNOSIS — K8010 Calculus of gallbladder with chronic cholecystitis without obstruction: Secondary | ICD-10-CM | POA: Diagnosis not present

## 2020-10-01 LAB — COMPREHENSIVE METABOLIC PANEL
ALT: 43 U/L (ref 0–44)
AST: 44 U/L — ABNORMAL HIGH (ref 15–41)
Albumin: 3.8 g/dL (ref 3.5–5.0)
Alkaline Phosphatase: 51 U/L (ref 38–126)
Anion gap: 8 (ref 5–15)
BUN: 13 mg/dL (ref 6–20)
CO2: 22 mmol/L (ref 22–32)
Calcium: 8.5 mg/dL — ABNORMAL LOW (ref 8.9–10.3)
Chloride: 105 mmol/L (ref 98–111)
Creatinine, Ser: 0.99 mg/dL (ref 0.61–1.24)
GFR, Estimated: >60 mL/min (ref >60)
Glucose, Bld: 143 mg/dL — ABNORMAL HIGH (ref 70–99)
Potassium: 5 mmol/L (ref 3.5–5.1)
Sodium: 135 mmol/L (ref 135–145)
Total Bilirubin: 1.1 mg/dL (ref 0.3–1.2)
Total Protein: 6.5 g/dL (ref 6.5–8.1)

## 2020-10-01 LAB — CBC
HCT: 38.4 % — ABNORMAL LOW (ref 39.0–52.0)
Hemoglobin: 13.2 g/dL (ref 13.0–17.0)
MCH: 31.5 pg (ref 26.0–34.0)
MCHC: 34.4 g/dL (ref 30.0–36.0)
MCV: 91.6 fL (ref 80.0–100.0)
Platelets: 172 10*3/uL (ref 150–400)
RBC: 4.19 MIL/uL — ABNORMAL LOW (ref 4.22–5.81)
RDW: 12.1 % (ref 11.5–15.5)
WBC: 12.5 10*3/uL — ABNORMAL HIGH (ref 4.0–10.5)
nRBC: 0 % (ref 0.0–0.2)

## 2020-10-01 MED ORDER — ONDANSETRON HCL 4 MG PO TABS: 4.0000 mg | ORAL_TABLET | Freq: Three times a day (TID) | ORAL | 0 refills | Status: DC | PRN

## 2020-10-01 MED ORDER — ACETAMINOPHEN 325 MG PO TABS
1000.0000 mg | ORAL_TABLET | Freq: Three times a day (TID) | ORAL | 0 refills | Status: AC
Start: 2020-10-01 — End: 2020-10-06

## 2020-10-01 MED ORDER — KETOROLAC TROMETHAMINE 30 MG/ML IJ SOLN
30.0000 mg | Freq: Three times a day (TID) | INTRAMUSCULAR | Status: DC
  Administered 2020-10-01: 30 mg via INTRAVENOUS
  Filled 2020-10-01: qty 1

## 2020-10-01 MED ORDER — OXYCODONE HCL 5 MG PO TABS: 5.0000 mg | ORAL_TABLET | Freq: Four times a day (QID) | ORAL | 0 refills | Status: DC | PRN

## 2020-10-01 NOTE — Plan of Care (Signed)
Plan of care reviewed and discussed with the patient. 

## 2020-10-01 NOTE — Plan of Care (Signed)
Pt ready for DC home with wife. Completed JP drain instruction with wife who has cared for one before. Instructed to write down the time and amount of drainage to show the doctor at followup appt.

## 2020-10-01 NOTE — Discharge Instructions (Signed)
[facility] LAPAROSCOPIC SURGERY: POST OP INSTRUCTIONS Always review your discharge instruction sheet given to you by the facility where your surgery was performed. IF YOU HAVE DISABILITY OR FAMILY LEAVE FORMS, YOU MUST BRING THEM TO THE OFFICE FOR PROCESSING.   DO NOT GIVE THEM TO YOUR DOCTOR.  PAIN CONTROL  1. First take acetaminophen (Tylenol) AND/or ibuprofen (Advil) to control your pain after surgery.  Follow directions on package.  Taking acetaminophen (Tylenol) and/or ibuprofen (Advil) regularly after surgery will help to control your pain and lower the amount of prescription pain medication you may need.  You should not take more than 3,000 mg (3 grams) of acetaminophen (Tylenol) in 24 hours.  You should not take ibuprofen (Advil), aleve, motrin, naprosyn or other NSAIDS if you have a history of stomach ulcers or chronic kidney disease.  2. A prescription for pain medication may be given to you upon discharge.  Take your pain medication as prescribed, if you still have uncontrolled pain after taking acetaminophen (Tylenol) or ibuprofen (Advil). 3. Use ice packs to help control pain. 4. If you need a refill on your pain medication, please contact your pharmacy.  They will contact our office to request authorization. Prescriptions will not be filled after 5pm or on week-ends.  HOME MEDICATIONS 5. Take your usually prescribed medications unless otherwise directed.  DIET 6. You should follow a light diet the first few days after arrival home.  Be sure to include lots of fluids daily. Avoid fatty, fried foods.   CONSTIPATION 7. It is common to experience some constipation after surgery and if you are taking pain medication.  Increasing fluid intake and taking a stool softener (such as Colace) will usually help or prevent this problem from occurring.  A mild laxative (Milk of Magnesia or Miralax) should be taken according to package instructions if there are no bowel  movements after 48 hours.  WOUND/INCISION CARE 8. Most patients will experience some swelling and bruising in the area of the incisions.  Ice packs will help.  Swelling and bruising can take several days to resolve.  9. Unless discharge instructions indicate otherwise, follow guidelines below  a. STERI-STRIPS - you may remove your outer bandages 48 hours after surgery, and you may shower at that time.  You have steri-strips (small skin tapes) in place directly over the incision.  These strips should be left on the skin for 7-10 days.   b. DERMABOND/SKIN GLUE - you may shower in 24 hours.  The glue will flake off over the next 2-3 weeks. 10. Any sutures or staples will be removed at the office during your follow-up visit.  ACTIVITIES 11. You may resume regular (light) daily activities beginning the next day--such as daily self-care, walking, climbing stairs--gradually increasing activities as tolerated.  You may have sexual intercourse when it is comfortable.  Refrain from any heavy lifting or straining until approved by your doctor. a. You may drive when you are no longer taking prescription pain medication, you can comfortably wear a seatbelt, and you can safely maneuver your car and apply brakes.  FOLLOW-UP 12. You should see your doctor in the office for a follow-up appointment approximately 2-3 weeks after your surgery.  You should have been given your post-op/follow-up appointment when your surgery was scheduled.  If you did not receive a post-op/follow-up appointment, make sure that you call for this appointment within a day or two after you arrive home to insure a convenient appointment time.  OTHER   INSTRUCTIONS 13.   WHEN TO CALL YOUR DOCTOR: 1. Fever over 101.0 2. Inability to urinate 3. Continued bleeding from incision. 4. Increased pain, redness, or drainage from the incision. 5. Increasing abdominal pain  The clinic staff is available to answer your questions during regular  business hours.  Please don't hesitate to call and ask to speak to one of the nurses for clinical concerns.  If you have a medical emergency, go to the nearest emergency room or call 911.  A surgeon from [facility] is always on call at the hospital. [address], [place], [place]  [address] ? [address] [address], [place], [place]   [address] [phone] ? [phone] ? FAX [phone] Web site: [email or web address]  [facility] Surgical drains are used to remove extra fluid that normally builds up in a surgical wound after surgery. A surgical drain helps to heal a surgical wound. Different kinds of surgical drains include:  Active drains. These drains use suction to pull drainage away from the surgical wound. Drainage flows through a tube to a container outside of the body. With these drains, you need to keep the bulb or the drainage container flat (compressed) at all times, except while you empty it. Flattening the bulb or container creates suction.  Passive drains. These drains allow fluid to drain naturally, by gravity. Drainage flows through a tube to a bandage (dressing) or a container outside of the body. Passive drains do not need to be emptied. A drain is placed during surgery. Right after surgery, drainage is usually bright red and a little thicker than water. The drainage may gradually turn yellow or pink and become thinner. It is likely that your health care provider will remove the drain when the drainage stops or when the amount decreases to 1-2 Tbsp (15-30 mL) during a 24-hour period. Supplies needed:  Tape.  Germ-free cleaning solution (sterile saline).  Cotton swabs.  Split gauze drain sponge: 4 x 4 inches (10 x 10 cm).  Gauze square: 4 x 4 inches (10 x 10 cm). How to care for your surgical drain Care for your drain as told by your health care provider. This is important to help prevent infection. If your drain is placed at  your back, or any other hard-to-reach area, ask another person to assist you in performing the following tasks: General care  Keep the skin around the drain dry and covered with a dressing at all times.  Check your drain area every day for signs of infection. Check for: ? Redness, swelling, or pain. ? Pus or a bad smell. ? Cloudy drainage. ? Tenderness or pressure at the drain exit site. Changing the dressing Follow instructions from your health care provider about how to change your dressing. Change your dressing at least once a day. Change it more often if needed to keep the dressing dry. Make sure you: 1. Gather your supplies. 2. Wash your hands with soap and water before you change your dressing. If soap and water are not available, use hand sanitizer. 3. Remove the old dressing. Avoid using scissors to do that. 4. Wash your hands with soap and water again after removing the old dressing. 5. Use sterile saline to clean your skin around the drain. You may need to use a cotton swab to clean the skin. 6. Place the tube through the slit in a drain sponge. Place the drain sponge so that it covers your wound. 7. Place the gauze square  or another drain sponge on top of the drain sponge that is on the wound. Make sure the tube is between those layers. 8. Tape the dressing to your skin. 9. Tape the drainage tube to your skin 1-2 inches (2.5-5 cm) below the place where the tube enters your body. Taping keeps the tube from pulling on any stitches (sutures) that you have. 10. Wash your hands with soap and water. 11. Write down the color of your drainage and how often you change your dressing. How to empty your active drain 1. Make sure that you have a measuring cup that you can empty your drainage into. 2. Wash your hands with soap and water. If soap and water are not available, use hand sanitizer. 3. Loosen any pins or clips that hold the tube in place. 4. If your health care provider tells you  to strip the tube to prevent clots and tube blockages: ? Hold the tube at the skin with one hand. Use your other hand to pinch the tubing with your thumb and first finger. ? Gently move your fingers down the tube while squeezing very lightly. This clears any drainage, clots, or tissue from the tube. ? You may need to do this several times each day to keep the tube clear. Do not pull on the tube. 5. Open the bulb cap or the drain plug. Do not touch the inside of the cap or the bottom of the plug. 6. Turn the device upside down and gently squeeze. 7. Empty all of the drainage into the measuring cup. 8. Compress the bulb or the container and replace the cap or the plug. To compress the bulb or the container, squeeze it firmly in the middle while you close the cap or plug the container. 9. Write down the amount of drainage that you have in each 24-hour period. If you have less than 2 Tbsp (30 mL) of drainage during 24 hours, contact your health care provider. 10. Flush the drainage down the toilet. 11. Wash your hands with soap and water.   Contact a health care provider if:  You have redness, swelling, or pain around your drain area.  You have pus or a bad smell coming from your drain area.  You have a fever or chills.  The skin around your drain is warm to the touch.  The amount of drainage that you have is increasing instead of decreasing.  You have drainage that is cloudy.  There is a sudden stop or a sudden decrease in the amount of drainage that you have.  Your drain tube falls out.  Your active drain does not stay compressed after you empty it. Summary  Surgical drains are used to remove extra fluid that normally builds up in a surgical wound after surgery.  Different kinds of surgical drains include active drains and passive drains. Active drains use suction to pull drainage away from the surgical wound, and passive drains allow fluid to drain naturally.  It is important to  care for your drain to prevent infection. If your drain is placed at your back, or any other hard-to-reach area, ask another person to assist you.  Contact your health care provider if you have redness, swelling, or pain around your drain area. This information is not intended to replace advice given to you by your health care provider. Make sure you discuss any questions you have with your health care provider. Document Revised: 08/03/2018 Document Reviewed: 08/03/2018 [employer] Patient Education  2021 [employer]  

## 2020-10-01 NOTE — Discharge Summary (Signed)
Physician Discharge Summary  Timothy Lester UTM:[id number] DOB: 13-Apr-1971 DOA: 09/30/2020  PCP: Timothy Bussing, MD  Admit date: 09/30/2020 Discharge date: 10/01/2020  Recommendations for Outpatient Follow-up:     Follow-up Information    Timothy Adu, MD. Schedule an appointment as soon as possible for a visit in 3 week(s).   Specialty: General Surgery Why: for postop op appt Contact information: [address] [place] [address] [phone]        [facility], [facility] Follow up in 3 day(s).   Specialty: General Surgery Why: for nurse visit for drain removal Contact information: [address] [place] [address] [phone]              Discharge Diagnoses:  1. Symptomatic cholelithiasis, probable chronic cholecystitis  Surgical Procedure: laparoscopic cholecystectomy  Discharge Condition: good Disposition: home  Diet recommendation: Regular  Filed Weights   09/30/20 1204  Weight: 87.1 kg    History of present illness:  Patient was brought in for planned laparoscopic cholecystectomy  Hospital Course:  Please see operative note for additional details.  He was kept overnight for observation because a clip could not be placed across the cystic duct due to how severely dilated it was.  I placed 2 PDS Endoloops around the cystic duct stump.  I wanted to keep him overnight for observation to monitor for any signs of a bile leak as well as for proper teaching of management of his surgical drain.  The patient also had a fair amount of nausea in the recovery room.  On postoperative day 1 his nausea had resolved.  He was tolerating a diet.  His pain was controlled.  His vital signs were stable.  We discussed surgical findings.  We discussed discharge instructions.  We discussed the surgical drain purpose.  He was deemed stable for discharge  BP 120/71 (BP Location: Right Arm)   Pulse 86   Temp 98.3 F (36.8 C) (Oral)   Resp 16   Ht  5\' 10"  (1.778 m)   Wt 87.1 kg   SpO2 97%   BMI 27.55 kg/m   Gen: alert, NAD, non-toxic appearing Pupils: equal, no scleral icterus Pulm: Lungs clear to auscultation, symmetric chest rise CV: regular rate and rhythm Abd: soft, mild tender, nondistended. jp -serosang.  No cellulitis. No incisional hernia Ext: no edema, no calf tenderness Skin: no rash, no jaundice    Discharge Instructions  Discharge Instructions    Call MD for:   Complete by: As directed    Temperature >101   Call MD for:  hives   Complete by: As directed    Call MD for:  persistant dizziness or light-headedness   Complete by: As directed    Call MD for:  persistant nausea and vomiting   Complete by: As directed    Call MD for:  redness, tenderness, or signs of infection (pain, swelling, redness, odor or green/yellow discharge around incision site)   Complete by: As directed    Call MD for:  severe uncontrolled pain   Complete by: As directed    Diet general   Complete by: As directed    Discharge instructions   Complete by: As directed    See [facility] discharge instructions   Increase activity slowly   Complete by: As directed      Allergies as of 10/01/2020   No Known Allergies     Medication List    TAKE these medications  acetaminophen 325 MG tablet Commonly known as: TYLENOL Take 3 tablets (975 mg total) by mouth every 8 (eight) hours for 5 days. What changed:   how much to take  when to take this  reasons to take this   ibuprofen 200 MG tablet Commonly known as: ADVIL Take 400-600 mg by mouth every 8 (eight) hours as needed for moderate pain.   ondansetron 4 MG tablet Commonly known as: Zofran Take 1 tablet (4 mg total) by mouth every 8 (eight) hours as needed for nausea or vomiting.   oxyCODONE 5 MG immediate release tablet Commonly known as: Oxy IR/ROXICODONE Take 1 tablet (5 mg total) by mouth every 6 (six) hours as needed for severe pain.       Follow-up Information     Timothy Adu, MD. Schedule an appointment as soon as possible for a visit in 3 week(s).   Specialty: General Surgery Why: for postop op appt Contact information: [address] [place] [address] [phone]        [facility], [facility] Follow up in 3 day(s).   Specialty: General Surgery Why: for nurse visit for drain removal Contact information: [address] [place] [address] [phone]                The results of significant diagnostics from this hospitalization (including imaging, microbiology, ancillary and laboratory) are listed below for reference.    Significant Diagnostic Studies: DG Cholangiogram Operative  Result Date: 09/30/2020 CLINICAL DATA:  Cholecystectomy for symptomatic cholelithiasis. EXAM: INTRAOPERATIVE CHOLANGIOGRAM TECHNIQUE: Cholangiographic images from the C-arm fluoroscopic device were submitted for interpretation post-operatively. Please see the procedural report for the amount of contrast and the fluoroscopy time utilized. COMPARISON:  Right upper quadrant ultrasound on 09/04/2020 FINDINGS: Intraoperative imaging with a C-arm shows reflux of contrast into the gallbladder lumen and some opacification of the cystic duct but no significant opacification common bile duct. Although cystic duct occlusion cannot be completely excluded, the lack of opacification of the common bile duct is likely due to the reflux of contrast rather than cystic duct obstruction. IMPRESSION: Limited intraoperative cholangiogram due to reflux of contrast in the gallbladder lumen with limited cystic duct opacification and lack of visualization of the common bile duct. Electronically Signed   By: Irish Lack M.D.   On: 09/30/2020 16:24   US ABDOMEN LIMITED RUQ (LIVER/GB)  Result Date: 09/04/2020 CLINICAL DATA:  50 year old male with right upper quadrant abdominal pain. EXAM: ULTRASOUND ABDOMEN LIMITED RIGHT UPPER QUADRANT COMPARISON:   Ultrasound dated 10/18/2013. FINDINGS: Gallbladder: There are stones and sludge within the gallbladder. No gallbladder wall thickening or pericholecystic fluid. Negative sonographic Murphy's sign. Common bile duct: Diameter: 9 mm Liver: There is diffuse increased liver echogenicity most commonly seen in the setting of fatty infiltration. Superimposed inflammation or fibrosis is not excluded. Clinical correlation is recommended. There is a 9 mm cyst in the right lobe of the liver. Portal vein is patent on color Doppler imaging with normal direction of blood flow towards the liver. Other: None. IMPRESSION: 1. Cholelithiasis without sonographic evidence of acute cholecystitis. 2. Fatty liver. Electronically Signed   By: Elgie Collard M.D.   On: 09/04/2020 22:21    Microbiology: Recent Results (from the past 240 hour(s))  SARS CORONAVIRUS 2 (TAT 6-24 HRS) Nasopharyngeal Nasopharyngeal Swab     Status: None   Collection Time: 09/26/20  3:09 PM   Specimen: Nasopharyngeal Swab  Result Value Ref Range Status   SARS  Coronavirus 2 NEGATIVE NEGATIVE Final    Comment: (NOTE) SARS-CoV-2 target nucleic acids are NOT DETECTED.  The SARS-CoV-2 RNA is generally detectable in upper and lower respiratory specimens during the acute phase of infection. Negative results do not preclude SARS-CoV-2 infection, do not rule out co-infections with other pathogens, and should not be used as the sole basis for treatment or other patient management decisions. Negative results must be combined with clinical observations, patient history, and epidemiological information. The expected result is Negative.  Fact Sheet for Patients: HairSlick.no  Fact Sheet for Healthcare Providers: quierodirigir.com  This test is not yet approved or cleared by the [place] FDA and  has been authorized for detection and/or diagnosis of SARS-CoV-2 by FDA under an Emergency Use  Authorization (EUA). This EUA will remain  in effect (meaning this test can be used) for the duration of the COVID-19 declaration under Se ction 564(b)(1) of the Act, 21 U.S.C. section 360bbb-3(b)(1), unless the authorization is terminated or revoked sooner.  Performed at [facility], [address], [place], [place] [address]      Labs: Basic Metabolic Panel: Recent Labs  Lab 09/27/20 1311 10/01/20 0259  NA 138 135  K 4.4 5.0  CL 104 105  CO2 28 22  GLUCOSE 102* 143*  BUN 13 13  CREATININE 0.91 0.99  CALCIUM 9.2 8.5*   Liver Function Tests: Recent Labs  Lab 09/27/20 1311 10/01/20 0259  AST 20 44*  ALT 26 43  ALKPHOS 58 51  BILITOT 0.9 1.1  PROT 7.2 6.5  ALBUMIN 4.2 3.8   No results for input(s): LIPASE, AMYLASE in the last 168 hours. No results for input(s): AMMONIA in the last 168 hours. CBC: Recent Labs  Lab 09/27/20 1311 10/01/20 0848  WBC 4.3 12.5*  NEUTROABS 2.2  --   HGB 14.9 13.2  HCT 42.7 38.4*  MCV 90.9 91.6  PLT 169 172   Cardiac Enzymes: No results for input(s): CKTOTAL, CKMB, CKMBINDEX, TROPONINI in the last 168 hours. BNP: BNP (last 3 results) No results for input(s): BNP in the last 8760 hours.  ProBNP (last 3 results) No results for input(s): PROBNP in the last 8760 hours.  CBG: No results for input(s): GLUCAP in the last 168 hours.  Active Problems:   S/P cholecystectomy   Time coordinating discharge: 15 min  Signed:  Atilano Ina, MD [facility], [place] [phone] 10/01/2020, 11:14 AM

## 2020-10-02 LAB — SURGICAL PATHOLOGY

## 2021-07-06 ENCOUNTER — Other Ambulatory Visit: Payer: Self-pay

## 2021-07-06 ENCOUNTER — Emergency Department (HOSPITAL_BASED_OUTPATIENT_CLINIC_OR_DEPARTMENT_OTHER)
Admission: EM | Admit: 2021-07-06 | Discharge: 2021-07-06 | Disposition: A | Discharge status: Home / Self Care | Payer: No Typology Code available for payment source | Attending: Emergency Medicine | Admitting: Emergency Medicine

## 2021-07-06 ENCOUNTER — Encounter (HOSPITAL_BASED_OUTPATIENT_CLINIC_OR_DEPARTMENT_OTHER): Payer: Self-pay | Admitting: *Deleted

## 2021-07-06 ENCOUNTER — Emergency Department (HOSPITAL_BASED_OUTPATIENT_CLINIC_OR_DEPARTMENT_OTHER): Payer: No Typology Code available for payment source

## 2021-07-06 DIAGNOSIS — N202 Calculus of kidney with calculus of ureter: Secondary | ICD-10-CM | POA: Insufficient documentation

## 2021-07-06 DIAGNOSIS — R109 Unspecified abdominal pain: Secondary | ICD-10-CM | POA: Diagnosis present

## 2021-07-06 DIAGNOSIS — N200 Calculus of kidney: Secondary | ICD-10-CM

## 2021-07-06 LAB — CBC WITH DIFFERENTIAL/PLATELET
Abs Immature Granulocytes: 0.02 10*3/uL (ref 0.00–0.07)
Basophils Absolute: 0 10*3/uL (ref 0.0–0.1)
Basophils Relative: 0 %
Eosinophils Absolute: 0.1 10*3/uL (ref 0.0–0.5)
Eosinophils Relative: 1 %
HCT: 43.4 % (ref 39.0–52.0)
Hemoglobin: 15.3 g/dL (ref 13.0–17.0)
Immature Granulocytes: 0 %
Lymphocytes Relative: 42 %
Lymphs Abs: 3 10*3/uL (ref 0.7–4.0)
MCH: 30.9 pg (ref 26.0–34.0)
MCHC: 35.3 g/dL (ref 30.0–36.0)
MCV: 87.7 fL (ref 80.0–100.0)
Monocytes Absolute: 0.6 10*3/uL (ref 0.1–1.0)
Monocytes Relative: 8 %
Neutro Abs: 3.4 10*3/uL (ref 1.7–7.7)
Neutrophils Relative %: 49 %
Platelets: 200 10*3/uL (ref 150–400)
RBC: 4.95 MIL/uL (ref 4.22–5.81)
RDW: 12 % (ref 11.5–15.5)
WBC: 7.1 10*3/uL (ref 4.0–10.5)
nRBC: 0 % (ref 0.0–0.2)

## 2021-07-06 LAB — URINALYSIS, ROUTINE W REFLEX MICROSCOPIC
Bilirubin Urine: NEGATIVE
Glucose, UA: NEGATIVE mg/dL
Ketones, ur: NEGATIVE mg/dL
Leukocytes,Ua: NEGATIVE
Nitrite: NEGATIVE
Protein, ur: 30 mg/dL — AB
RBC / HPF: >50 RBC/hpf — ABNORMAL HIGH (ref 0–5)
Specific Gravity, Urine: 1.029 (ref 1.005–1.030)
pH: 6 (ref 5.0–8.0)

## 2021-07-06 LAB — BASIC METABOLIC PANEL
Anion gap: 8 (ref 5–15)
BUN: 16 mg/dL (ref 6–20)
CO2: 27 mmol/L (ref 22–32)
Calcium: 9 mg/dL (ref 8.9–10.3)
Chloride: 105 mmol/L (ref 98–111)
Creatinine, Ser: 1.07 mg/dL (ref 0.61–1.24)
GFR, Estimated: >60 mL/min (ref >60)
Glucose, Bld: 132 mg/dL — ABNORMAL HIGH (ref 70–99)
Potassium: 4 mmol/L (ref 3.5–5.1)
Sodium: 140 mmol/L (ref 135–145)

## 2021-07-06 MED ORDER — ONDANSETRON HCL 4 MG/2ML IJ SOLN
4.0000 mg | Freq: Once | INTRAMUSCULAR | Status: AC
  Administered 2021-07-06: 03:00:00 4 mg via INTRAVENOUS
  Filled 2021-07-06: qty 2

## 2021-07-06 MED ORDER — KETOROLAC TROMETHAMINE 30 MG/ML IJ SOLN
30.0000 mg | Freq: Once | INTRAMUSCULAR | Status: AC
  Administered 2021-07-06: 04:00:00 30 mg via INTRAVENOUS
  Filled 2021-07-06: qty 1

## 2021-07-06 MED ORDER — OXYCODONE-ACETAMINOPHEN 5-325 MG PO TABS
1.0000 | ORAL_TABLET | Freq: Once | ORAL | Status: AC
  Administered 2021-07-06: 04:00:00 1 via ORAL
  Filled 2021-07-06: qty 1

## 2021-07-06 MED ORDER — MORPHINE SULFATE (PF) 4 MG/ML IV SOLN
4.0000 mg | Freq: Once | INTRAVENOUS | Status: AC
  Administered 2021-07-06: 03:00:00 4 mg via INTRAVENOUS
  Filled 2021-07-06: qty 1

## 2021-07-06 MED ORDER — OXYCODONE-ACETAMINOPHEN 5-325 MG PO TABS: 1.0000 | ORAL_TABLET | Freq: Four times a day (QID) | ORAL | 0 refills | Status: DC | PRN

## 2021-07-06 MED ORDER — ONDANSETRON 4 MG PO TBDP: 4.0000 mg | ORAL_TABLET | Freq: Three times a day (TID) | ORAL | 0 refills | Status: DC | PRN

## 2021-07-06 MED ORDER — SODIUM CHLORIDE 0.9 % IV BOLUS
1000.0000 mL | Freq: Once | INTRAVENOUS | Status: AC
  Administered 2021-07-06: 03:00:00 1000 mL via INTRAVENOUS

## 2021-07-06 NOTE — ED Triage Notes (Signed)
Pt states he woke up at 2am with severe right flank pain that radiates around to abdomen and groin; pt last took tylenol 30 mins pta

## 2021-07-06 NOTE — ED Notes (Signed)
Pt gone to CT 

## 2021-07-06 NOTE — ED Provider Notes (Signed)
[facility] EMERGENCY DEPT Provider Note   CSN: [id number] Arrival date & time: 07/06/21  0250     History Chief Complaint  Patient presents with   Flank Pain    Right side     Timothy Lester is a 50 y.o. male.  HPI     This 50 year old male with a history of cholecystectomy who presents with right-sided flank pain.  Patient reports he first started having symptoms last night around 9 PM.  He took ibuprofen before going to bed.  At 2 AM he woke up with sharp right-sided abdominal pain that radiates into his right groin.  He has noted darkening of his urine.  No dysuria.  No fevers.  No known history of kidney stones.  Currently rates his pain at 4 out of 10.  He did take Tylenol prior to arrival.  Past Medical History:  Diagnosis Date   Anxiety    Depression    PONV (postoperative nausea and vomiting)    as a kid     Patient Active Problem List   Diagnosis Date Noted   S/P cholecystectomy 09/30/2020   Gallstone 11/14/2013    Past Surgical History:  Procedure Laterality Date   CHOLECYSTECTOMY N/A 09/30/2020   Procedure: LAPAROSCOPIC CHOLECYSTECTOMY WITH  INTRAOPERATIVE CHOLANGIOGRAM;  Surgeon: [name], MD;  Location: [facility];  Service: General;  Laterality: N/A;  90 MIN   ERCP N/A 10/19/2013   Procedure: ENDOSCOPIC RETROGRADE CHOLANGIOPANCREATOGRAPHY (ERCP);  Surgeon: Barrie Folk, MD;  Location: [facility];  Service: Endoscopy;  Laterality: N/A;   EUS N/A 10/26/2013   Procedure: ESOPHAGEAL ENDOSCOPIC ULTRASOUND (EUS) RADIAL;  Surgeon: Willis Modena, MD;  Location: [facility] ENDOSCOPY;  Service: Endoscopy;  Laterality: N/A;   EYE SURGERY     Lasik   FRACTURE SURGERY         History reviewed. No pertinent family history.  Social History   Tobacco Use   Smoking status: Never   Smokeless tobacco: Never  Vaping Use   Vaping Use: Never used  Substance Use Topics   Alcohol use: No   Drug use: No    Home Medications Prior to Admission  medications   Medication Sig Start Date End Date Taking? Authorizing Provider  ondansetron (ZOFRAN-ODT) 4 MG disintegrating tablet Take 1 tablet (4 mg total) by mouth every 8 (eight) hours as needed for nausea or vomiting. 07/06/21  Yes Lyann Hagstrom, Mayer Masker, MD  oxyCODONE-acetaminophen (PERCOCET/ROXICET) 5-325 MG tablet Take 1 tablet by mouth every 6 (six) hours as needed for severe pain. 07/06/21  Yes Clyde Upshaw, Mayer Masker, MD  ibuprofen (ADVIL) 200 MG tablet Take 400-600 mg by mouth every 8 (eight) hours as needed for moderate pain.    [provider]  ondansetron (ZOFRAN) 4 MG tablet Take 1 tablet (4 mg total) by mouth every 8 (eight) hours as needed for nausea or vomiting. 10/01/20   [name], MD  oxyCODONE (OXY IR/ROXICODONE) 5 MG immediate release tablet Take 1 tablet (5 mg total) by mouth every 6 (six) hours as needed for severe pain. 10/01/20   [name], MD    Allergies    Patient has no known allergies.  Review of Systems   Review of Systems  Constitutional:  Negative for fever.  Respiratory:  Negative for shortness of breath.   Cardiovascular:  Negative for chest pain.  Gastrointestinal:  Positive for abdominal pain and nausea. Negative for constipation, diarrhea and vomiting.  Genitourinary:  Positive for flank pain. Negative for dysuria.  All  other systems reviewed and are negative.  Physical Exam Updated Vital Signs BP (!) 169/84 (BP Location: Right Arm)    Pulse 82    Temp 97.7 F (36.5 C) (Oral)    Resp 18    Ht 1.778 m (5\' 10" )    Wt 86.2 kg    SpO2 99%    BMI 27.26 kg/m   Physical Exam Vitals and nursing note reviewed.  Constitutional:      Appearance: He is well-developed. He is not ill-appearing.  HENT:     Head: Normocephalic and atraumatic.     Nose: Nose normal.     Mouth/Throat:     Mouth: Mucous membranes are moist.  Eyes:     Pupils: Pupils are equal, round, and reactive to light.  Cardiovascular:     Rate and Rhythm: Normal rate and regular  rhythm.     Heart sounds: Normal heart sounds. No murmur heard. Pulmonary:     Effort: Pulmonary effort is normal. No respiratory distress.     Breath sounds: Normal breath sounds. No wheezing.  Abdominal:     Palpations: Abdomen is soft.     Tenderness: There is no abdominal tenderness. There is no right CVA tenderness, left CVA tenderness or rebound.  Musculoskeletal:     Cervical back: Neck supple.     Right lower leg: No edema.     Left lower leg: No edema.  Lymphadenopathy:     Cervical: No cervical adenopathy.  Skin:    General: Skin is warm and dry.  Neurological:     Mental Status: He is alert and oriented to person, place, and time.  Psychiatric:        Mood and Affect: Mood normal.    ED Results / Procedures / Treatments   Labs (all labs ordered are listed, but only abnormal results are displayed) Labs Reviewed  BASIC METABOLIC PANEL - Abnormal; Notable for the following components:      Result Value   Glucose, Bld 132 (*)    All other components within normal limits  URINALYSIS, ROUTINE W REFLEX MICROSCOPIC - Abnormal; Notable for the following components:   APPearance HAZY (*)    Hgb urine dipstick LARGE (*)    Protein, ur 30 (*)    RBC / HPF >50 (*)    Bacteria, UA RARE (*)    All other components within normal limits  CBC WITH DIFFERENTIAL/PLATELET    EKG None  Radiology CT Renal Stone Study  Result Date: 07/06/2021 CLINICAL DATA:  Right-sided flank pain for a few hours, initial encounter EXAM: CT ABDOMEN AND PELVIS WITHOUT CONTRAST TECHNIQUE: Multidetector CT imaging of the abdomen and pelvis was performed following the standard protocol without IV contrast. COMPARISON:  None. FINDINGS: Lower chest: No acute abnormality. Hepatobiliary: Scattered hypodensities are noted within the liver consistent with small cysts. Gallbladder has been surgically removed. Pancreas: Unremarkable. No pancreatic ductal dilatation or surrounding inflammatory changes. Spleen:  Normal in size without focal abnormality. Adrenals/Urinary Tract: Adrenal glands are within normal limits. Kidneys are well visualized bilaterally. Mild hydronephrosis and hydroureter is noted on the right secondary to a small 2 mm stone in the proximal right ureter. More distal ureter is within normal limits. Bladder is well distended. Left ureter is unremarkable. Stomach/Bowel: The appendix is within normal limits. No obstructive or inflammatory changes of colon are seen. Stomach and small bowel are within normal limits. Vascular/Lymphatic: No significant vascular findings are present. No enlarged abdominal or pelvic lymph nodes. Reproductive: Prostate  is unremarkable. Other: No abdominal wall hernia or abnormality. No abdominopelvic ascites. Musculoskeletal: Postsurgical changes in the left femur are noted. No acute bony abnormality is seen. IMPRESSION: 2 mm proximal right ureteral stone just below the UPJ with mild hydronephrosis. No other focal abnormality is noted. Electronically Signed   By: Alcide Clever M.D.   On: 07/06/2021 03:32    Procedures Procedures   Medications Ordered in ED Medications  oxyCODONE-acetaminophen (PERCOCET/ROXICET) 5-325 MG per tablet 1 tablet (has no administration in time range)  morphine 4 MG/ML injection 4 mg (4 mg Intravenous Given 07/06/21 0312)  ondansetron (ZOFRAN) injection 4 mg (4 mg Intravenous Given 07/06/21 0311)  sodium chloride 0.9 % bolus 1,000 mL (1,000 mLs Intravenous New Bag/Given 07/06/21 0312)  ketorolac (TORADOL) 30 MG/ML injection 30 mg (30 mg Intravenous Given 07/06/21 0348)    ED Course  I have reviewed the triage vital signs and the nursing notes.  Pertinent labs & imaging results that were available during my care of the patient were reviewed by me and considered in my medical decision making (see chart for details).    MDM Rules/Calculators/A&P                          Patient presents with right-sided flank pain and right lower  quadrant pain.  He is nontoxic and vital signs are reassuring.  Pain is highly suggestive of a kidney stone.  UTI, appendicitis are also potential.  Patient was given fluids, pain, nausea medication.  Labs obtained.  No significant metabolic derangements.  No leukocytosis.  No evidence of UTI.  CT scan obtained and reviewed.  Shows a distal 2 mm right ureteral stone.  This is likely the culprit of the patient's symptoms.  We discussed at home management.  Advised patient that given the Christmas holiday, he will likely need to get prescriptions filled at the 24-hour pharmacy.  Patient stated understanding.  After history, exam, and medical workup I feel the patient has been appropriately medically screened and is safe for discharge home. Pertinent diagnoses were discussed with the patient. Patient was given return precautions.      Final Clinical Impression(s) / ED Diagnoses Final diagnoses:  Kidney stone    Rx / DC Orders ED Discharge Orders          Ordered    oxyCODONE-acetaminophen (PERCOCET/ROXICET) 5-325 MG tablet  Every 6 hours PRN        07/06/21 0403    ondansetron (ZOFRAN-ODT) 4 MG disintegrating tablet  Every 8 hours PRN        07/06/21 0403             Shon Baton, MD 07/06/21 [phone]

## 2021-07-06 NOTE — Discharge Instructions (Signed)
You were seen today for flank pain.  You do have a 2 mm kidney stone.  Make sure you are staying hydrated.  Take pain and nausea medication as prescribed.  If you have new or worsening symptoms, follow-up with urology.

## 2022-09-23 DIAGNOSIS — M9903 Segmental and somatic dysfunction of lumbar region: Secondary | ICD-10-CM | POA: Diagnosis not present

## 2022-09-23 DIAGNOSIS — M9905 Segmental and somatic dysfunction of pelvic region: Secondary | ICD-10-CM | POA: Diagnosis not present

## 2022-09-23 DIAGNOSIS — M9902 Segmental and somatic dysfunction of thoracic region: Secondary | ICD-10-CM | POA: Diagnosis not present

## 2022-09-23 DIAGNOSIS — M9901 Segmental and somatic dysfunction of cervical region: Secondary | ICD-10-CM | POA: Diagnosis not present

## 2022-09-25 DIAGNOSIS — M9902 Segmental and somatic dysfunction of thoracic region: Secondary | ICD-10-CM | POA: Diagnosis not present

## 2022-09-25 DIAGNOSIS — M9903 Segmental and somatic dysfunction of lumbar region: Secondary | ICD-10-CM | POA: Diagnosis not present

## 2022-09-25 DIAGNOSIS — M9906 Segmental and somatic dysfunction of lower extremity: Secondary | ICD-10-CM | POA: Diagnosis not present

## 2022-09-25 DIAGNOSIS — M9905 Segmental and somatic dysfunction of pelvic region: Secondary | ICD-10-CM | POA: Diagnosis not present

## 2022-09-25 DIAGNOSIS — M9901 Segmental and somatic dysfunction of cervical region: Secondary | ICD-10-CM | POA: Diagnosis not present

## 2022-09-28 DIAGNOSIS — M9901 Segmental and somatic dysfunction of cervical region: Secondary | ICD-10-CM | POA: Diagnosis not present

## 2022-09-28 DIAGNOSIS — M9903 Segmental and somatic dysfunction of lumbar region: Secondary | ICD-10-CM | POA: Diagnosis not present

## 2022-09-28 DIAGNOSIS — M9905 Segmental and somatic dysfunction of pelvic region: Secondary | ICD-10-CM | POA: Diagnosis not present

## 2022-09-28 DIAGNOSIS — M9906 Segmental and somatic dysfunction of lower extremity: Secondary | ICD-10-CM | POA: Diagnosis not present

## 2022-09-28 DIAGNOSIS — M9902 Segmental and somatic dysfunction of thoracic region: Secondary | ICD-10-CM | POA: Diagnosis not present

## 2023-01-15 DIAGNOSIS — M25511 Pain in right shoulder: Secondary | ICD-10-CM | POA: Insufficient documentation

## 2023-01-15 DIAGNOSIS — R52 Pain, unspecified: Secondary | ICD-10-CM | POA: Insufficient documentation

## 2023-01-18 DIAGNOSIS — M25511 Pain in right shoulder: Secondary | ICD-10-CM | POA: Diagnosis not present

## 2023-01-20 DIAGNOSIS — M25511 Pain in right shoulder: Secondary | ICD-10-CM | POA: Diagnosis not present

## 2023-02-17 DIAGNOSIS — M19011 Primary osteoarthritis, right shoulder: Secondary | ICD-10-CM | POA: Diagnosis not present

## 2023-02-17 DIAGNOSIS — M75121 Complete rotator cuff tear or rupture of right shoulder, not specified as traumatic: Secondary | ICD-10-CM | POA: Diagnosis not present

## 2023-02-19 ENCOUNTER — Ambulatory Visit: Payer: BC Managed Care – PPO | Admitting: Physician Assistant

## 2023-02-19 DIAGNOSIS — M75120 Complete rotator cuff tear or rupture of unspecified shoulder, not specified as traumatic: Secondary | ICD-10-CM | POA: Insufficient documentation

## 2023-02-19 DIAGNOSIS — M19019 Primary osteoarthritis, unspecified shoulder: Secondary | ICD-10-CM | POA: Insufficient documentation

## 2023-06-14 ENCOUNTER — Encounter: Payer: Self-pay | Admitting: Physician Assistant

## 2023-06-14 ENCOUNTER — Ambulatory Visit (INDEPENDENT_AMBULATORY_CARE_PROVIDER_SITE_OTHER): Payer: BC Managed Care – PPO | Admitting: Physician Assistant

## 2023-06-14 VITALS — BP 142/88 | HR 78 | Temp 97.3°F | Ht 70.0 in | Wt 209.2 lb

## 2023-06-14 DIAGNOSIS — R5383 Other fatigue: Secondary | ICD-10-CM

## 2023-06-14 DIAGNOSIS — G479 Sleep disorder, unspecified: Secondary | ICD-10-CM | POA: Diagnosis not present

## 2023-06-14 DIAGNOSIS — R030 Elevated blood-pressure reading, without diagnosis of hypertension: Secondary | ICD-10-CM

## 2023-06-14 DIAGNOSIS — Z131 Encounter for screening for diabetes mellitus: Secondary | ICD-10-CM

## 2023-06-14 DIAGNOSIS — Z13220 Encounter for screening for lipoid disorders: Secondary | ICD-10-CM | POA: Diagnosis not present

## 2023-06-14 DIAGNOSIS — F439 Reaction to severe stress, unspecified: Secondary | ICD-10-CM

## 2023-06-14 DIAGNOSIS — G4733 Obstructive sleep apnea (adult) (pediatric): Secondary | ICD-10-CM | POA: Insufficient documentation

## 2023-06-14 DIAGNOSIS — Z125 Encounter for screening for malignant neoplasm of prostate: Secondary | ICD-10-CM

## 2023-06-14 DIAGNOSIS — K760 Fatty (change of) liver, not elsewhere classified: Secondary | ICD-10-CM

## 2023-06-14 DIAGNOSIS — F419 Anxiety disorder, unspecified: Secondary | ICD-10-CM | POA: Diagnosis not present

## 2023-06-14 LAB — HEMOGLOBIN A1C: Hgb A1c MFr Bld: 6.1 % (ref 4.6–6.5)

## 2023-06-14 LAB — CBC WITH DIFFERENTIAL/PLATELET
Basophils Absolute: 0 10*3/uL (ref 0.0–0.1)
Basophils Relative: 0.3 % (ref 0.0–3.0)
Eosinophils Absolute: 0 10*3/uL (ref 0.0–0.7)
Eosinophils Relative: 0.3 % (ref 0.0–5.0)
HCT: 46.4 % (ref 39.0–52.0)
Hemoglobin: 15.9 g/dL (ref 13.0–17.0)
Lymphocytes Relative: 20.3 % (ref 12.0–46.0)
Lymphs Abs: 1.5 10*3/uL (ref 0.7–4.0)
MCHC: 34.3 g/dL (ref 30.0–36.0)
MCV: 90.8 fL (ref 78.0–100.0)
Monocytes Absolute: 0.5 10*3/uL (ref 0.1–1.0)
Monocytes Relative: 6.6 % (ref 3.0–12.0)
Neutro Abs: 5.2 10*3/uL (ref 1.4–7.7)
Neutrophils Relative %: 72.5 % (ref 43.0–77.0)
Platelets: 180 10*3/uL (ref 150.0–400.0)
RBC: 5.11 Mil/uL (ref 4.22–5.81)
RDW: 13.1 % (ref 11.5–15.5)
WBC: 7.2 10*3/uL (ref 4.0–10.5)

## 2023-06-14 LAB — COMPREHENSIVE METABOLIC PANEL
ALT: 39 U/L (ref 0–53)
AST: 21 U/L (ref 0–37)
Albumin: 4.7 g/dL (ref 3.5–5.2)
Alkaline Phosphatase: 67 U/L (ref 39–117)
BUN: 15 mg/dL (ref 6–23)
CO2: 25 meq/L (ref 19–32)
Calcium: 9.5 mg/dL (ref 8.4–10.5)
Chloride: 105 meq/L (ref 96–112)
Creatinine, Ser: 0.91 mg/dL (ref 0.40–1.50)
GFR: 98.22 mL/min (ref >60.00)
Glucose, Bld: 104 mg/dL — ABNORMAL HIGH (ref 70–99)
Potassium: 4 meq/L (ref 3.5–5.1)
Sodium: 138 meq/L (ref 135–145)
Total Bilirubin: 0.8 mg/dL (ref 0.2–1.2)
Total Protein: 7.1 g/dL (ref 6.0–8.3)

## 2023-06-14 LAB — T4, FREE: Free T4: 0.7 ng/dL (ref 0.60–1.60)

## 2023-06-14 LAB — LIPID PANEL
Cholesterol: 199 mg/dL (ref 0–200)
HDL: 43.4 mg/dL (ref >39.00)
LDL Cholesterol: 112 mg/dL — ABNORMAL HIGH (ref 0–99)
NonHDL: 155.47
Total CHOL/HDL Ratio: 5
Triglycerides: 215 mg/dL — ABNORMAL HIGH (ref 0.0–149.0)
VLDL: 43 mg/dL — ABNORMAL HIGH (ref 0.0–40.0)

## 2023-06-14 LAB — TSH: TSH: 0.94 u[IU]/mL (ref 0.35–5.50)

## 2023-06-14 LAB — PSA: PSA: 1.3 ng/mL (ref 0.10–4.00)

## 2023-06-14 NOTE — Patient Instructions (Addendum)
Welcome to [facility]! It was a pleasure meeting you today.  Will review labs and then discuss plan from there.  PLEASE NOTE:  If you had any LAB tests please let us know if you have not heard back within a few days. You may see your results on MyChart before we have a chance to review them but we will give you a call once they are reviewed by [place]. If we ordered any REFERRALS today, please let us know if you have not heard from their office within the next two weeks. Let us know through MyChart if you are needing REFILLS, or have your pharmacy send [place] the request. You can also use MyChart to communicate with me or any office staff.  Please try these tips to maintain a healthy lifestyle:  Eat most of your calories during the day when you are active. Eliminate processed foods including packaged sweets (pies, cakes, cookies), reduce intake of potatoes, white bread, white pasta, and white rice. Look for whole grain options, oat flour or almond flour.  Each meal should contain half fruits/vegetables, one quarter protein, and one quarter carbs (no bigger than a computer mouse).  Cut down on sweet beverages. This includes juice, soda, and sweet tea. Also watch fruit intake, though this is a healthier sweet option, it still contains natural sugar! Limit to 3 servings daily.  Drink at least 1 glass of water with each meal and aim for at least 8 glasses (64 ounces) per day.  Exercise at least 150 minutes every week to the best of your ability.    Take Care,  Jearldine Cassady, PA-C

## 2023-06-14 NOTE — Progress Notes (Signed)
Patient ID: Timothy Lester, male    DOB: 27-Sep-1970, 52 y.o.   MRN: [id number]   Assessment & Plan:  Anxiety -     CBC with Differential/Platelet -     Comprehensive metabolic panel -     TSH -     Hemoglobin A1c -     T4, free  Other fatigue -     CBC with Differential/Platelet -     Comprehensive metabolic panel -     TSH -     Hemoglobin A1c -     PSA -     T4, free  Difficulty sleeping -     CBC with Differential/Platelet -     Comprehensive metabolic panel -     TSH -     Hemoglobin A1c -     PSA -     T4, free  Elevated blood pressure reading  Stress  Screening for cholesterol level -     Lipid panel  Screening for diabetes mellitus -     Hemoglobin A1c  Prostate cancer screening -     PSA  Fatty liver    Assessment and Plan    Anxiety Chronic anxiety with significant impact on sleep and daily functioning. Family history of anxiety and possible ADHD. Previous negative experience with psychiatric medications. -Order comprehensive metabolic panel, thyroid function tests, and testosterone level to rule out metabolic causes of anxiety and fatigue. -Consider starting non-addictive, daily medication (Zoloft or Lexapro to start) to manage anxiety if labs are normal.     06/14/2023   12:43 PM  GAD 7 : Generalized Anxiety Score  Nervous, Anxious, on Edge 3  Control/stop worrying 3  Worry too much - different things 3  Trouble relaxing 3  Restless 3  Easily annoyed or irritable 3  Afraid - awful might happen 2  Total GAD 7 Score 20  Anxiety Difficulty Somewhat difficult     Difficulty sleeping Reports of snoring and poor sleep quality. -Consider referral for sleep study if anxiety is managed and sleep issues persist. -Encourage regular exercise and healthy diet.  Fatty Liver Noted on ultrasound two years ago. -Order liver function tests as part of comprehensive metabolic panel. -Consider follow-up ultrasound if liver enzymes are  elevated.  Possible undiagnosed ADHD contributing to anxiety. -Elevated adult ADHD screening level today.   General Health Maintenance -Order PSA level as part of routine screening for prostate cancer. -Advise to schedule colonoscopy for routine colorectal cancer screening.        Return in about 6 weeks (around 07/26/2023) for recheck/follow-up, blood pressure check.    Subjective:    Chief Complaint  Patient presents with   Anxiety    Pot in office to establish care with PCP; pt states he is fasting today for appt; Patient has anxiety and would like to discuss today.     Anxiety     Discussed the use of AI scribe software for clinical note transcription with the patient, who gave verbal consent to proceed.  History of Present Illness   The patient, with a past medical history of IBS, kidney stone, and fatty liver, presents with significant anxiety. He reports that the anxiety is affecting his sleep, energy levels, and overall quality of life. The patient describes feeling constantly tired and having difficulty sleeping, which he attributes to his anxiety. He also reports a lack of energy and motivation to engage in physical activities, such as working out. The patient's anxiety is pervasive,  affecting his ability to focus and causing him to feel overwhelmed and irritable. He also reports physical discomfort, including body aches, when lying in bed for extended periods. The patient has a family history of ADHD in two brothers and acknowledges potential symptoms of ADHD in himself, including difficulty focusing and a tendency to speak rapidly.    His mother was a very anxious person. He would not be opposed to seeing a Veterinary surgeon. He denies any SI.    Past Medical History:  Diagnosis Date   Anxiety    Depression    Kidney stone    PONV (postoperative nausea and vomiting)    as a kid     Past Surgical History:  Procedure Laterality Date   CHOLECYSTECTOMY N/A 09/30/2020    Procedure: LAPAROSCOPIC CHOLECYSTECTOMY WITH  INTRAOPERATIVE CHOLANGIOGRAM;  Surgeon: [name], MD;  Location: [facility];  Service: General;  Laterality: N/A;  90 MIN   ERCP N/A 10/19/2013   Procedure: ENDOSCOPIC RETROGRADE CHOLANGIOPANCREATOGRAPHY (ERCP);  Surgeon: Barrie Folk, MD;  Location: [facility];  Service: Endoscopy;  Laterality: N/A;   EUS N/A 10/26/2013   Procedure: ESOPHAGEAL ENDOSCOPIC ULTRASOUND (EUS) RADIAL;  Surgeon: Willis Modena, MD;  Location: [facility];  Service: Endoscopy;  Laterality: N/A;   EYE SURGERY     Lasik   FRACTURE SURGERY Left    femur - age 45 - titanium rod    Family History  Problem Relation Age of Onset   Anxiety disorder Mother    COPD Mother    Heart attack Mother    Pancreatic cancer Father    ADD / ADHD Brother        medical doctor   Depression Brother    ADD / ADHD Brother    Diabetes Paternal Grandmother    Stroke Paternal Grandfather    Alcoholism Paternal Grandfather     Social History   Tobacco Use   Smoking status: Never   Smokeless tobacco: Never  Vaping Use   Vaping status: Never Used  Substance Use Topics   Alcohol use: No   Drug use: No     Allergies  Allergen Reactions   Octacosanol Other (See Comments)    Review of Systems NEGATIVE UNLESS OTHERWISE INDICATED IN HPI      Objective:     BP (!) 142/88 (BP Location: Right Arm, Patient Position: Sitting)   Pulse 78   Temp (!) 97.3 F (36.3 C) (Temporal)   Ht 5\' 10"  (1.778 m)   Wt 209 lb 3.2 oz (94.9 kg)   SpO2 97%   BMI 30.02 kg/m   Wt Readings from Last 3 Encounters:  06/14/23 209 lb 3.2 oz (94.9 kg)  07/06/21 190 lb (86.2 kg)  09/30/20 192 lb (87.1 kg)    BP Readings from Last 3 Encounters:  06/14/23 (!) 142/88  07/06/21 (!) 150/107  10/01/20 120/71     Physical Exam Vitals and nursing note reviewed.  Constitutional:      General: He is not in acute distress.    Appearance: Normal appearance. He is not toxic-appearing.  HENT:      Head: Normocephalic and atraumatic.     Right Ear: Tympanic membrane, ear canal and external ear normal.     Left Ear: Tympanic membrane, ear canal and external ear normal.     Nose: Nose normal.     Mouth/Throat:     Mouth: Mucous membranes are moist.     Pharynx: Oropharynx is clear.  Eyes:  Extraocular Movements: Extraocular movements intact.     Conjunctiva/sclera: Conjunctivae normal.     Pupils: Pupils are equal, round, and reactive to light.  Cardiovascular:     Rate and Rhythm: Normal rate and regular rhythm.     Pulses: Normal pulses.     Heart sounds: Normal heart sounds.  Pulmonary:     Effort: Pulmonary effort is normal.     Breath sounds: Normal breath sounds.  Musculoskeletal:        General: Normal range of motion.     Cervical back: Normal range of motion and neck supple.  Skin:    General: Skin is warm and dry.  Neurological:     General: No focal deficit present.     Mental Status: He is alert and oriented to person, place, and time.  Psychiatric:        Mood and Affect: Mood is anxious.        Speech: Speech is rapid and pressured.          Time Spent: 50 minutes of total time was spent on the date of the encounter performing the following actions: chart review prior to seeing the patient, obtaining history, performing a medically necessary exam, counseling on the treatment plan, placing orders, and documenting in our EHR.       Davied Nocito M Vaniah Chambers, PA-C

## 2023-06-15 ENCOUNTER — Other Ambulatory Visit: Payer: Self-pay | Admitting: Physician Assistant

## 2023-06-15 MED ORDER — ESCITALOPRAM OXALATE 10 MG PO TABS: ORAL_TABLET | ORAL | 1 refills | Status: DC

## 2023-06-25 ENCOUNTER — Ambulatory Visit: Payer: BC Managed Care – PPO | Admitting: Physician Assistant

## 2023-07-09 ENCOUNTER — Encounter: Payer: Self-pay | Admitting: Physician Assistant

## 2023-07-09 MED ORDER — ESCITALOPRAM OXALATE 10 MG PO TABS: 10.0000 mg | ORAL_TABLET | Freq: Every day | ORAL | 2 refills | Status: DC

## 2023-07-26 ENCOUNTER — Other Ambulatory Visit: Payer: Self-pay

## 2023-07-26 ENCOUNTER — Encounter: Payer: Self-pay | Admitting: Physician Assistant

## 2023-07-26 ENCOUNTER — Ambulatory Visit (INDEPENDENT_AMBULATORY_CARE_PROVIDER_SITE_OTHER): Payer: BC Managed Care – PPO | Admitting: Physician Assistant

## 2023-07-26 VITALS — BP 126/88 | HR 71 | Temp 98.4°F | Ht 70.0 in | Wt 204.2 lb

## 2023-07-26 DIAGNOSIS — R5383 Other fatigue: Secondary | ICD-10-CM | POA: Diagnosis not present

## 2023-07-26 DIAGNOSIS — F439 Reaction to severe stress, unspecified: Secondary | ICD-10-CM

## 2023-07-26 DIAGNOSIS — R0781 Pleurodynia: Secondary | ICD-10-CM

## 2023-07-26 DIAGNOSIS — E7800 Pure hypercholesterolemia, unspecified: Secondary | ICD-10-CM | POA: Diagnosis not present

## 2023-07-26 DIAGNOSIS — R7303 Prediabetes: Secondary | ICD-10-CM | POA: Insufficient documentation

## 2023-07-26 DIAGNOSIS — F419 Anxiety disorder, unspecified: Secondary | ICD-10-CM

## 2023-07-26 DIAGNOSIS — G479 Sleep disorder, unspecified: Secondary | ICD-10-CM

## 2023-07-26 LAB — TESTOSTERONE: Testosterone: 198.17 ng/dL — ABNORMAL LOW (ref 300.00–890.00)

## 2023-07-26 NOTE — Progress Notes (Signed)
 Patient ID: Timothy Lester, male    DOB: 11/14/1970, 53 y.o.   MRN: [id number]   Assessment & Plan:  Other fatigue -     Testosterone   Prediabetes  High cholesterol  Anxiety  Stress  Rib pain on right side     Assessment and Plan    Anxiety Improved with Lexapro  10mg  daily. Noted increased anxiety in the mornings before medication. Discussed potential future dose increase if symptoms worsen. -Continue Lexapro  10mg  daily.  Prediabetes and Hyperlipidemia Discussed lifestyle modifications including diet and exercise. Noted limitations due to rotator cuff injury. -Encouraged cardiovascular exercise and dietary changes. -Plan to recheck labs in 6 months. Lab Results  Component Value Date   HGBA1C 6.1 06/14/2023   The 10-year ASCVD risk score (Arnett DK, et al., 2019) is: 3.9%   Values used to calculate the score:     Age: 29 years     Sex: Male     Is Non-Hispanic African American: No     Diabetic: No     Tobacco smoker: No     Systolic Blood Pressure: 126 mmHg     Is BP treated: No     HDL Cholesterol: 43.4 mg/dL     Total Cholesterol: 199 mg/dL  Possible Low Testosterone  Patient reports fatigue. Plan to check testosterone  levels. -Draw testosterone  level today.  Rib Strain Sustained while sledding. Pain with movement and deep breaths. No respiratory distress. -Continue ibuprofen as needed for pain. -Apply ice to the area. -Consider holding a small pillow against the area if coughing or sneezing to minimize discomfort.         Return in about 6 months (around 01/23/2024) for recheck/follow-up, fasting labs .    Subjective:    Chief Complaint  Patient presents with   F/U on blood pressure    Has not been checking at home. Pt denies headaches, dizziness, blurred vision, chest pain, SOB or lower leg edema. Denies excessive caffeine intake, stimulant usage, excessive alcohol intake or increase in salt consumption.     Muscle Pain    Pt was sledding  over the weekend and feels like he pulled a muscle right rib area.    Muscle Pain    History of Present Illness   The patient, with a history of anxiety, prediabetes, high cholesterol, and a R torn rotator cuff, reports an improvement in his anxiety symptoms since starting Lexapro . However, he still experiences symptoms in the morning before taking his medication. He has made dietary changes to address his prediabetes and high cholesterol, and is attempting to incorporate more exercise into his routine, despite limitations due to his torn rotator cuff. The patient also reports a recent injury from sledding that is causing significant pain, suspected to be a R rib strain. He is requesting T level to be checked today due to fatigue and difficulty with intimacy.    Past Medical History:  Diagnosis Date   Anxiety    Depression    Kidney stone    PONV (postoperative nausea and vomiting)    as a kid     Past Surgical History:  Procedure Laterality Date   CHOLECYSTECTOMY N/A 09/30/2020   Procedure: LAPAROSCOPIC CHOLECYSTECTOMY WITH  INTRAOPERATIVE CHOLANGIOGRAM;  Surgeon: Tanda Locus, MD;  Location: [facility];  Service: General;  Laterality: N/A;  90 MIN   ERCP N/A 10/19/2013   Procedure: ENDOSCOPIC RETROGRADE CHOLANGIOPANCREATOGRAPHY (ERCP);  Surgeon: Norleen JAYSON Hint, MD;  Location: [facility];  Service: Endoscopy;  Laterality: N/A;  EUS N/A 10/26/2013   Procedure: ESOPHAGEAL ENDOSCOPIC ULTRASOUND (EUS) RADIAL;  Surgeon: Elsie Cree, MD;  Location: [facility];  Service: Endoscopy;  Laterality: N/A;   EYE SURGERY     Lasik   FRACTURE SURGERY Left    femur - age 82 - titanium rod    Family History  Problem Relation Age of Onset   Anxiety disorder Mother    COPD Mother    Heart attack Mother    Pancreatic cancer Father    ADD / ADHD Brother        medical doctor   Depression Brother    ADD / ADHD Brother    Diabetes Paternal Grandmother    Stroke Paternal Grandfather     Alcoholism Paternal Grandfather     Social History   Tobacco Use   Smoking status: Never   Smokeless tobacco: Never  Vaping Use   Vaping status: Never Used  Substance Use Topics   Alcohol use: No   Drug use: No     Allergies  Allergen Reactions   Octacosanol Other (See Comments)    Review of Systems NEGATIVE UNLESS OTHERWISE INDICATED IN HPI      Objective:     BP 126/88 (BP Location: Left Arm, Patient Position: Sitting, Cuff Size: Large)   Pulse 71   Temp 98.4 F (36.9 C) (Temporal)   Ht 5' 10 (1.778 m)   Wt 204 lb 4 oz (92.6 kg)   SpO2 98%   BMI 29.31 kg/m   Wt Readings from Last 3 Encounters:  07/26/23 204 lb 4 oz (92.6 kg)  06/14/23 209 lb 3.2 oz (94.9 kg)  07/06/21 190 lb (86.2 kg)    BP Readings from Last 3 Encounters:  07/26/23 126/88  06/14/23 (!) 142/88  07/06/21 (!) 150/107     Physical Exam Vitals and nursing note reviewed.  Constitutional:      Appearance: Normal appearance.  Eyes:     Extraocular Movements: Extraocular movements intact.     Conjunctiva/sclera: Conjunctivae normal.     Pupils: Pupils are equal, round, and reactive to light.  Cardiovascular:     Rate and Rhythm: Normal rate and regular rhythm.     Pulses: Normal pulses.     Heart sounds: Normal heart sounds. No murmur heard. Pulmonary:     Effort: Pulmonary effort is normal.     Breath sounds: Normal breath sounds.  Musculoskeletal:        General: Tenderness (R lower ribs; no bruising or swelling noted) present.  Neurological:     General: No focal deficit present.     Mental Status: He is alert and oriented to person, place, and time.  Psychiatric:        Mood and Affect: Mood normal.        Behavior: Behavior normal.        Tharun Cappella M Cyleigh Massaro, PA-C

## 2023-07-26 NOTE — Addendum Note (Signed)
 Addended by: Lorn Junes on: 07/26/2023 02:32 PM   Modules accepted: Orders

## 2023-07-27 ENCOUNTER — Other Ambulatory Visit: Payer: BC Managed Care – PPO

## 2023-07-27 ENCOUNTER — Other Ambulatory Visit: Payer: Self-pay

## 2023-07-27 DIAGNOSIS — R7989 Other specified abnormal findings of blood chemistry: Secondary | ICD-10-CM

## 2023-07-27 DIAGNOSIS — G479 Sleep disorder, unspecified: Secondary | ICD-10-CM

## 2023-07-27 DIAGNOSIS — R5383 Other fatigue: Secondary | ICD-10-CM

## 2023-07-27 DIAGNOSIS — F419 Anxiety disorder, unspecified: Secondary | ICD-10-CM

## 2023-07-27 LAB — TESTOSTERONE: Testosterone: 377.18 ng/dL (ref 300.00–890.00)

## 2023-08-01 ENCOUNTER — Encounter: Payer: Self-pay | Admitting: Physician Assistant

## 2023-08-02 ENCOUNTER — Other Ambulatory Visit: Payer: Self-pay

## 2023-08-02 DIAGNOSIS — R7989 Other specified abnormal findings of blood chemistry: Secondary | ICD-10-CM

## 2023-08-10 ENCOUNTER — Other Ambulatory Visit: Payer: Self-pay

## 2023-08-10 MED ORDER — ESCITALOPRAM OXALATE 10 MG PO TABS: 10.0000 mg | ORAL_TABLET | Freq: Every day | ORAL | 0 refills | Status: DC

## 2023-09-14 ENCOUNTER — Encounter: Payer: Self-pay | Admitting: Physician Assistant

## 2023-09-14 NOTE — Telephone Encounter (Signed)
 Please see pt msg and advise on increase of dose; pt last seen in January

## 2023-10-11 ENCOUNTER — Encounter: Payer: Self-pay | Admitting: Physician Assistant

## 2023-10-11 ENCOUNTER — Ambulatory Visit (INDEPENDENT_AMBULATORY_CARE_PROVIDER_SITE_OTHER): Admitting: Physician Assistant

## 2023-10-11 VITALS — BP 120/76 | HR 70 | Temp 98.1°F | Ht 70.0 in | Wt 204.0 lb

## 2023-10-11 DIAGNOSIS — R7989 Other specified abnormal findings of blood chemistry: Secondary | ICD-10-CM

## 2023-10-11 DIAGNOSIS — Z1211 Encounter for screening for malignant neoplasm of colon: Secondary | ICD-10-CM

## 2023-10-11 DIAGNOSIS — R5383 Other fatigue: Secondary | ICD-10-CM

## 2023-10-11 DIAGNOSIS — F419 Anxiety disorder, unspecified: Secondary | ICD-10-CM | POA: Diagnosis not present

## 2023-10-11 LAB — TESTOSTERONE: Testosterone: 249.73 ng/dL — ABNORMAL LOW (ref 300.00–890.00)

## 2023-10-11 MED ORDER — ESCITALOPRAM OXALATE 20 MG PO TABS: 20.0000 mg | ORAL_TABLET | Freq: Every day | ORAL | 1 refills | Status: DC

## 2023-10-11 NOTE — Progress Notes (Signed)
 Patient ID: MAKAEL STEIN, male    DOB: Mar 05, 1971, 53 y.o.   MRN: [id number]   Assessment & Plan:  Anxiety -     Escitalopram Oxalate; Take 1 tablet (20 mg total) by mouth daily.  Dispense: 90 tablet; Refill: 1  Low testosterone in male -     Testosterone  Other fatigue  Screening for colon cancer -     Ambulatory referral to Gastroenterology      Assessment and Plan Assessment & Plan Generalized Anxiety Disorder Anxiety not fully controlled by current Lexapro (15 mg). Symptoms include early morning anxiety and evening racing thoughts. Initial effectiveness suggests possible tolerance. Increasing dose to 20 mg, the maximum recommended, was discussed. Augmentation with another medication may be considered if symptoms persist. - Increase Lexapro to 20 mg daily - Monitor symptoms and effectiveness of increased dose - Consider augmentation with another medication if needed  Fatigue Persistent fatigue and difficulty initiating exercise. Fatigue may be related to anxiety, sleep issues, or other underlying conditions. Inconsistent testosterone levels in previous tests could contribute to fatigue. Discussed potential expanded lab testing if results remain inconsistent. - Recheck testosterone levels - Consider referral to specialized lab for expanded testing if results remain inconsistent  Sleep Disturbance Reports sleep disturbances, including nocturnal awakenings and feeling unrested. Suspected sleep apnea, but hesitant to undergo a sleep study or use a CPAP machine due to personal preferences and family history. Discussed importance of good sleep for overall health and potential alternative treatments like mouth guards. - Discuss potential benefits of sleep study and alternative treatments like mouth guards  General Health Maintenance Due for routine health maintenance, including cholesterol and A1c monitoring. Has a colonoscopy referral and is scheduled for follow-up in July to  reassess these parameters. - Ensure colonoscopy referral is completed - Schedule follow-up appointment in July for cholesterol and A1c monitoring     Subjective:    Chief Complaint  Patient presents with   Medical Management of Chronic Issues    Pt in office to discuss medication and possible dosage change; Pt also requesting to recheck testosterone levels; pt states current dose is going well but feels like its no longer effective by early morning; pt admits feeling tired all the time; pt is fasting for labs this morning. Also placed order for referral to complete colonoscopy     HPI Discussed the use of AI scribe software for clinical note transcription with the patient, who gave verbal consent to proceed.  History of Present Illness SHAQUAN MISSEY is a 53 year old male who presents with persistent fatigue and sleep disturbances.  He experiences persistent fatigue and sleep disturbances, characterized by waking up around 2 or 3 AM and feeling anxious if he does not take his medication by 7 AM. He has racing thoughts in the evening and difficulty sleeping, which leaves him feeling tired throughout the day. His body feels sore upon waking, particularly his back and sides, and he experiences pain if he stays in bed past 7 AM.  He is currently taking Lexapro 15 mg in the morning around 6:30 AM. While it helps during the day, its effects seem to wear off by the morning, leading to increased anxiety. Initially, Lexapro was more effective, and he was able to sleep better. He is concerned about the medication's diminishing effectiveness over time.  He has undergone testosterone testing, which initially showed low levels, but subsequent tests showed high levels, leading to confusion about the results. He is reluctant  to undergo further testing due to the variability in results.  He lacks motivation to engage in physical activities he previously enjoyed, such as running and working out, due to  his persistent fatigue. He fears that exercising might exacerbate his tiredness.  He has not undergone a sleep study, although his wife suspects he might have a sleep disorder. He is hesitant to use a CPAP machine, citing his father's experience with it.     Past Medical History:  Diagnosis Date   Anxiety    Depression    Kidney stone    PONV (postoperative nausea and vomiting)    as a kid     Past Surgical History:  Procedure Laterality Date   CHOLECYSTECTOMY N/A 09/30/2020   Procedure: LAPAROSCOPIC CHOLECYSTECTOMY WITH  INTRAOPERATIVE CHOLANGIOGRAM;  Surgeon: [name], MD;  Location: [facility];  Service: General;  Laterality: N/A;  90 MIN   ERCP N/A 10/19/2013   Procedure: ENDOSCOPIC RETROGRADE CHOLANGIOPANCREATOGRAPHY (ERCP);  Surgeon: Barrie Folk, MD;  Location: [facility];  Service: Endoscopy;  Laterality: N/A;   EUS N/A 10/26/2013   Procedure: ESOPHAGEAL ENDOSCOPIC ULTRASOUND (EUS) RADIAL;  Surgeon: Willis Modena, MD;  Location: [facility];  Service: Endoscopy;  Laterality: N/A;   EYE SURGERY     Lasik   FRACTURE SURGERY Left    femur - age 63 - titanium rod    Family History  Problem Relation Age of Onset   Anxiety disorder Mother    COPD Mother    Heart attack Mother    Pancreatic cancer Father    ADD / ADHD Brother        medical doctor   Depression Brother    ADD / ADHD Brother    Diabetes Paternal Grandmother    Stroke Paternal Grandfather    Alcoholism Paternal Grandfather     Social History   Tobacco Use   Smoking status: Never   Smokeless tobacco: Never  Vaping Use   Vaping status: Never Used  Substance Use Topics   Alcohol use: No   Drug use: No     Allergies  Allergen Reactions   Octacosanol Other (See Comments)    Review of Systems NEGATIVE UNLESS OTHERWISE INDICATED IN HPI      Objective:     BP 120/76 (BP Location: Left Arm, Patient Position: Sitting, Cuff Size: Normal)   Pulse 70   Temp 98.1 F (36.7 C) (Temporal)    Ht 5\' 10"  (1.778 m)   Wt 204 lb (92.5 kg)   SpO2 96%   BMI 29.27 kg/m   Wt Readings from Last 3 Encounters:  10/11/23 204 lb (92.5 kg)  07/26/23 204 lb 4 oz (92.6 kg)  06/14/23 209 lb 3.2 oz (94.9 kg)    BP Readings from Last 3 Encounters:  10/11/23 120/76  07/26/23 126/88  06/14/23 (!) 142/88     Physical Exam Vitals and nursing note reviewed.  Constitutional:      Appearance: Normal appearance.  Eyes:     Extraocular Movements: Extraocular movements intact.     Conjunctiva/sclera: Conjunctivae normal.     Pupils: Pupils are equal, round, and reactive to light.  Cardiovascular:     Rate and Rhythm: Normal rate and regular rhythm.  Pulmonary:     Effort: Pulmonary effort is normal.     Breath sounds: Normal breath sounds.  Skin:    General: Skin is warm.  Neurological:     General: No focal deficit present.     Mental Status: He  is alert and oriented to person, place, and time.  Psychiatric:        Mood and Affect: Mood normal.        Behavior: Behavior normal.             Deyna Carbon M Clatie Kessen, PA-C

## 2023-10-12 NOTE — Telephone Encounter (Signed)
 Please see pt appt that is scheduled with Dr Jon Billings

## 2023-10-12 NOTE — Telephone Encounter (Signed)
 Please see request from PCP and schedule pt to Dr Jon Billings. PCP would like to know when patient is scheduled. Thanks

## 2023-10-25 ENCOUNTER — Encounter: Payer: Self-pay | Admitting: Internal Medicine

## 2023-10-25 ENCOUNTER — Ambulatory Visit (INDEPENDENT_AMBULATORY_CARE_PROVIDER_SITE_OTHER): Admitting: Internal Medicine

## 2023-10-25 VITALS — BP 118/74 | HR 83 | Temp 98.6°F | Ht 70.0 in | Wt 202.4 lb

## 2023-10-25 DIAGNOSIS — F419 Anxiety disorder, unspecified: Secondary | ICD-10-CM

## 2023-10-25 DIAGNOSIS — E291 Testicular hypofunction: Secondary | ICD-10-CM | POA: Diagnosis not present

## 2023-10-25 DIAGNOSIS — G4733 Obstructive sleep apnea (adult) (pediatric): Secondary | ICD-10-CM | POA: Diagnosis not present

## 2023-10-25 DIAGNOSIS — R7303 Prediabetes: Secondary | ICD-10-CM

## 2023-10-25 DIAGNOSIS — E785 Hyperlipidemia, unspecified: Secondary | ICD-10-CM | POA: Diagnosis not present

## 2023-10-25 MED ORDER — TESTOSTERONE 20.25 MG/1.25GM (1.62%) TD GEL: 40.5000 mg | Freq: Every day | TRANSDERMAL | 2 refills | Status: DC

## 2023-10-25 NOTE — Assessment & Plan Note (Signed)
 Lab Results  Component Value Date   HGBA1C 6.1 06/14/2023   \Advised patient this will likely improve with treatment/exercise

## 2023-10-25 NOTE — Assessment & Plan Note (Signed)
 Suspected Sleep Apnea Patient reports restless sleep and symptoms suggestive of possible sleep-disordered breathing, which requires monitoring especially with initiation of TRT that may exacerbate underlying sleep apnea.     Sleep Apnea Evaluation Patient will use spouse's Apple Watch to track sleep patterns for objective data on potential sleep disturbances. Formal sleep study to be considered based on continued symptoms or if TRT appears to worsen sleep quality. Sleep hygiene recommendations provided, including consistent bedtime, sleep environment optimization, and screen time limitations.

## 2023-10-25 NOTE — Assessment & Plan Note (Signed)
 Anxiety Management Continue current anxiety medication with monitoring for changes as testosterone levels normalize. Discussed potential for testosterone therapy to impact mood, with possibility of improved mood stability but also monitoring for irritability. Assess need for adjustment of anxiety management approach at follow-up visits.

## 2023-10-25 NOTE — Assessment & Plan Note (Signed)
 Cardiometabolic Health Lifestyle modifications emphasized to address dyslipidemia and prediabetes: Mediterranean diet pattern, reduced refined carbohydrates, increased physical activity. Recommend gradual return to exercise program with goal of 150 minutes weekly of moderate activity plus resistance training 2-3 times weekly. Anticipate potential improvement in lipid profile and glycemic control with TRT. Reassess lipids and HbA1c in 6 months.

## 2023-10-25 NOTE — Patient Instructions (Addendum)
 YOUR AFTER VISIT SUMMARY - TESTOSTERONE THERAPY   Today's Diagnosis: Low Testosterone (Hypogonadism) What is Hypogonadism? Low testosterone is a condition where your body doesn't produce enough of the hormone testosterone. This can cause symptoms like fatigue, reduced sex drive, mood changes, and decreased strength. YOUR TESTOSTERONE TREATMENT PLAN: ?? Medication: AndroGel 1.62% (testosterone gel pump) ?? Dosage: 2 pumps (40.5 mg) each morning ?? Timing: Apply once daily, preferably after showering in the morning ? Duration: Long-term therapy with regular monitoring    Testosterone Gel Application Instructions Step Instructions  When to Apply Apply at the same time each morning, preferably after showering  Application Sites Apply to shoulders, upper arms, or abdomen - NEVER apply to genitals or areas that will contact others  Application Technique Make sure skin is completely clean and dry Press the pump firmly to dispense gel onto palm Apply gel in a thin layer over application area Allow gel to dry completely (3-5 minutes) before dressing Wash hands thoroughly with soap and water immediately after application  Important Precautions Cover application site with clothing before contact with others Wait at least 2 hours after application before swimming or showering Women and children must avoid contact with application sites or unwashed clothing If someone accidentally contacts the gel, they should wash area immediately with soap and water  WHAT TO EXPECT:  Initial improvements in energy and mood may begin within 2-3 weeks  Sexual function typically improves within 3-6 weeks  Muscle strength and body composition changes take 3-6 months  Maximum benefits may take up to 1 year  POSSIBLE SIDE EFFECTS:  Skin irritation at application site  Increase in red blood cell count  Acne or oily skin  Mild fluid retention  Changes in mood or energy levels  Possible worsening of sleep apnea  symptoms  CALL YOUR DOCTOR IMMEDIATELY if you experience: Severe headache or dizziness Chest pain or shortness of breath Swelling of lower legs or feet Prolonged erection (lasting more than 4 hours) Breast tenderness or enlargement Significant mood changes or aggression Worsening sleep issues or increased snoring    Lifestyle Recommendations to Support Testosterone Treatment Exercise Resistance training: 2-3 sessions weekly (exercises like squats, push-ups, and weight lifting) Aerobic exercise: Build up to 150 minutes weekly of moderate activity (walking, swimming, cycling) Start gradually if you haven't been exercising regularly  Nutrition Focus on whole foods: fruits, vegetables, lean proteins, healthy fats Maintain adequate protein intake (aim for 1.2-1.6g per kg of body weight) Include healthy fats from sources like olive oil, nuts, and avocados Limit alcohol consumption (no more than 1-2 drinks daily) Avoid processed foods and excessive sugar intake  Sleep Optimization Aim for 7-9 hours of quality sleep nightly Maintain consistent sleep schedule (even on weekends) Create cool, dark sleeping environment Limit screen time 1-2 hours before bed Use the Apple Watch to monitor your sleep patterns  Stress Management Practice regular stress-reduction techniques (meditation, deep breathing) Maintain social connections and support systems Consider mindfulness practices or guided relaxation Balance work and recovery time  Your Follow-Up Plan Lab Tests Blood test in 6 weeks to check testosterone level (2-4 hours after morning gel application)  Next Appointment Return visit in 6 weeks to assess response to therapy and review laboratory results  Additional Monitoring Complete blood count, PSA, and liver function tests in 3 months  Questions or concerns? Please contact our office at [facility] PHONE NUMBER].

## 2023-10-25 NOTE — Assessment & Plan Note (Signed)
 Hypogonadism (E29.1) Laboratory-confirmed hypogonadism with total testosterone consistently below reference range (249.73 ng/dL most recently, prior values 198.17 ng/dL and 413.24 ng/dL) and positive symptom burden demonstrated by 9/10 positive responses on ADAM questionnaire. Net benefit/risk score is favorable at 5+ (6 benefit factors, 0-1 risk factors). Symptoms significantly impact quality of life and daily function.   Hypogonadism Management Diagnostic Completion: Order LH and FSH to determine primary vs. secondary hypogonadism etiology. Testosterone Replacement: Initiated AndroGel 1.62% pump, 2 pumps (40.5 mg) daily applied to clean, dry skin of shoulders, upper arms, or abdomen in the morning. Monitoring Protocol:  Total testosterone level in 4-6 weeks, 2-4 hours after morning application CBC to monitor hematocrit at 3 months PSA monitoring (currently normal at 1.3) in 3 months Liver function monitoring with comprehensive metabolic panel in 3 months Patient Education: Detailed instructions provided on gel application, precautions regarding transfer to women and children, and expected benefits/side effects.

## 2023-10-25 NOTE — Progress Notes (Signed)
 ==============================  [facility]: [phone]   -- Medical Office Visit --  Patient: Timothy Lester      Age: 53 y.o.       Sex:  male  Date:   10/25/2023 Today's Healthcare Provider: Lula Olszewski, MD  ==============================   Chief Complaint: LOW TESTOSTERONE (Pt states has been going on for while.) and Anxiety (Pt states very tired no energy he is on meds for anxiety now.)  History of Present Illness 53 year old male presenting with symptoms of hypogonadism. The patient completed the Androgen Deficiency in Aging Males (ADAM) questionnaire with 9 positive responses, strongly suggesting testosterone deficiency. His symptoms include:     Core Symptoms Decreased libido/sex drive Lack of energy/persistent fatigue Decreased strength and endurance Height loss Mood changes (sadness, irritability) Weaker erections Decreased sports capability Falling asleep after dinner Deterioration in work performance  The patient describes the past year as "the worst of his life" due to these symptoms. Laboratory confirmation shows consistently low testosterone levels with morning testosterone of 198.17 ng/dL on 82/95/6213, a subsequent level of 377.18 ng/dL on 08/65/7846, and most recent value of 249.73 ng/dL on 96/29/5284. He reports significant decline in function over the past 5 years. Previously, he had lost over twenty pounds and was physically active, but now lacks the energy to exercise regularly. He attributes some muscle mass decrease to a torn rotator cuff injury sustained last summer while diving into a pool. SLEEP CONCERNS: Patient reports significant sleep disturbances with restlessness throughout the night. No formal diagnosis of sleep apnea, but symptoms warrant further evaluation, especially prior to initiating testosterone therapy which may exacerbate undiagnosed sleep-disordered breathing.  He reports experiencing anxiety over  the past couple of years, which he attributes to various stressors including his wife's health issues. Currently taking anxiety medication which provides partial relief but does not last throughout the day. Previous interventions: The patient has attempted lifestyle modifications without significant improvement in symptoms. Recent labs show borderline elevated A1c of 6.1% and dyslipidemia with LDL 112 mg/dL and triglycerides 132 mg/dL. Benefit/Risk Assessment for TRT: Patient has multiple benefit likelihood factors including total testosterone <300 ng/dL (on 2 of 3 measurements), multiple classic symptoms, age <65 years, and failure of lifestyle modification trial. Risk factors are minimal with normal hematocrit (46.4%) and no history of VTE, prostate cancer, severe sleep apnea, recent cardiovascular events, or heart failure.  Background: Reviewed: He has Calculus of gallbladder without cholecystitis without obstruction; S/P cholecystectomy; Full thickness rotator cuff tear; Inflammatory pain; OSA (obstructive sleep apnea); Osteoarthritis of acromioclavicular joint; Pain in joint of right shoulder; Anxiety; Prediabetes; High cholesterol; Hypogonadism in male; and Dyslipidemia on their problem list.  Reviewed: He  has a past medical history of Anxiety, Depression, Kidney stone, and PONV (postoperative nausea and vomiting).  Manually updated:  Problem  Hypogonadism in Male   HYPOGONADISM (E29.1)  Problem Status: Active - Newly Diagnosed - Updated 10/25/2023 Summary: Symptomatic hypogonadism confirmed by multiple morning testosterone levels below reference range. Initiated testosterone replacement therapy (AndroGel 1.62%, 40.5mg  daily). Multiple symptoms including fatigue, decreased libido, and decreased strength. Normal hematocrit at baseline, with favorable benefit/risk assessment.   Diagnostic Information: Diagnostic Criteria Findings  Laboratory Confirmation Total Testosterone: 249.73 ng/dL  (44/07/270) - Below reference range Total Testosterone: 377.18 ng/dL (53/66/4403) - Low normal Total Testosterone: 198.17 ng/dL (47/42/5956) - Below reference range Additional testing pending: LH, FSH (to determine primary vs. secondary etiology)  Symptom Assessment ADAM Questionnaire: 9/10 positive responses, strongly suggesting symptomatic hypogonadism  Risk Assessment Benefit factors (+1 each): Total T <200 ng/dL Multiple classic symptoms Age <65 years Failed adequate lifestyle modification trial No benefit from treating other potential causes Risk factors (+1 each): No history of VTE/thrombotic events Normal baseline Hct 46.4% No current or past prostate cancer Possible untreated sleep apnea - requires monitoring No recent cardiovascular events No uncontrolled heart failure Net score: 5+ (Favorable benefit/risk ratio)  Timeline: Date Event  07/26/2023 Initial low testosterone level: 198.17 ng/dL  16/04/9603 Follow-up testosterone level: 377.18 ng/dL  54/03/8118 Confirmatory testosterone level: 249.73 ng/dL  14/78/2956 Initial evaluation, diagnosis confirmed, TRT initiated with AndroGel 1.62% (40.5mg  daily)  In 6 weeks Scheduled follow-up testosterone level and clinical reassessment  In 3 months Planned comprehensive monitoring: CBC, PSA, liver function  Treatment Information: Category Details  Current Management Testosterone replacement therapy: AndroGel 1.62% pump, 2 pumps (40.5mg ) applied daily to upper body (shoulders, upper arms, or abdomen) Medication initiated: 10/25/2023  Treatment Goal Total testosterone level: 500-700 ng/dL Symptom improvement, particularly energy, libido, and strength Maintenance of normal hematocrit (<52%) Stable PSA and prostate health  Monitoring Plan Testosterone level: 6 weeks after initiation (draw 2-4 hours after morning application) CBC with hematocrit: Every 3 months initially, then every 6-12 months if stable PSA: Every 3-6 months Liver  function tests: Baseline and at 3 months Symptom assessment: Every visit  Special Considerations Sleep monitoring for potential worsening of suspected sleep apnea Transfer precautions: Patient educated about avoiding medication transfer to women and children Mood monitoring: Current anxiety diagnosis may be impacted by testosterone therapy Cardiometabolic profile: Potential for improvement in lipids and glycemic control with treatment  Related Health Conditions: Condition Relationship to Hypogonadism  Suspected Sleep Apnea Requires monitoring; testosterone therapy may potentially exacerbate underlying sleep-disordered breathing. Patient using Apple Watch to monitor sleep patterns.  Dyslipidemia LDL 112 mg/dL, Triglycerides 213 mg/dL, HDL 08.6 mg/dL. Low testosterone may contribute to dyslipidemia; potential improvement with TRT.  Prediabetes HbA1c 6.1%. Hypogonadism associated with insulin resistance; may see improvement in glycemic control with testosterone normalization.  Anxiety Current anxiety symptoms may be partially related to hypogonadism; monitor for changes with testosterone therapy.  FOLLOW-UP PLAN:  Laboratory follow-up in 6 weeks: Total testosterone level  Clinical follow-up in 6 weeks to assess symptom response and review labs  Comprehensive monitoring at 3 months: Testosterone, CBC, PSA, liver function  If symptoms of sleep apnea worsen, expedite evaluation with formal sleep study  Shared Decision-Making: Patient participated in detailed discussion of risks, benefits, and alternatives to testosterone therapy. Patient understood expected benefits, potential risks, monitoring requirements, and transfer precautions. Patient expressed preference for gel formulation over injections based on convenience and application control.   Dyslipidemia    Reviewed:  Allergies as of 10/25/2023 - Review Complete 10/25/2023  Allergen Reaction Noted   Octacosanol Other (See Comments) 06/14/2023     Medications: Reviewed: Current Outpatient Medications on File Prior to Visit  Medication Sig   escitalopram (LEXAPRO) 20 MG tablet Take 1 tablet (20 mg total) by mouth daily.   ibuprofen (ADVIL) 200 MG tablet Take 400-600 mg by mouth every 8 (eight) hours as needed for moderate pain (pain score 4-6).   No current facility-administered medications on file prior to visit.  There are no discontinued medications.       Physical Exam:    10/25/2023   11:16 AM 10/11/2023    7:58 AM 07/26/2023    9:26 AM  Vitals with BMI  Height 5\' 10"  5\' 10"  5\' 10"   Weight 202 lbs 6 oz 204  lbs 204 lbs 4 oz  BMI 29.04 29.27 29.31  Systolic 118 120 161  Diastolic 74 76 88  Pulse 83 70 71   Wt Readings from Last 10 Encounters:  10/25/23 202 lb 6.4 oz (91.8 kg)  10/11/23 204 lb (92.5 kg)  07/26/23 204 lb 4 oz (92.6 kg)  06/14/23 209 lb 3.2 oz (94.9 kg)  07/06/21 190 lb (86.2 kg)  09/30/20 192 lb (87.1 kg)  11/14/13 195 lb 3.2 oz (88.5 kg)  10/19/13 190 lb (86.2 kg)  Vital signs reviewed.  Nursing notes reviewed. Weight trend reviewed. Physical Exam  Physical Exam  Truncal adiposity, moderate muscle mass, very polite/kind General Appearance:  No acute distress appreciable.   Well-groomed, healthy-appearing male.  Well proportioned with no abnormal fat distribution.  Good muscle tone. Pulmonary:  Normal work of breathing at rest, no respiratory distress apparent. SpO2: 98 %  Musculoskeletal: All extremities are intact.  Neurological:  Awake, alert, oriented, and engaged.  No obvious focal neurological deficits or cognitive impairments.  Sensorium seems unclouded.   Speech is clear and coherent with logical content. Psychiatric:  Appropriate mood, pleasant and cooperative demeanor, thoughtful and engaged during the exam     No results found for any visits on 10/25/23. Office Visit on 10/11/2023  Component Date Value   Testosterone 10/11/2023 249.73 (L)   Lab on 07/27/2023  Component Date  Value   Testosterone 07/27/2023 377.18   Office Visit on 07/26/2023  Component Date Value   Testosterone 07/26/2023 198.17 (L)   Office Visit on 06/14/2023  Component Date Value   WBC 06/14/2023 7.2    RBC 06/14/2023 5.11    Hemoglobin 06/14/2023 15.9    HCT 06/14/2023 46.4    MCV 06/14/2023 90.8    MCHC 06/14/2023 34.3    RDW 06/14/2023 13.1    Platelets 06/14/2023 180.0    Neutrophils Relative % 06/14/2023 72.5    Lymphocytes Relative 06/14/2023 20.3    Monocytes Relative 06/14/2023 6.6    Eosinophils Relative 06/14/2023 0.3    Basophils Relative 06/14/2023 0.3    Neutro Abs 06/14/2023 5.2    Lymphs Abs 06/14/2023 1.5    Monocytes Absolute 06/14/2023 0.5    Eosinophils Absolute 06/14/2023 0.0    Basophils Absolute 06/14/2023 0.0    Sodium 06/14/2023 138    Potassium 06/14/2023 4.0    Chloride 06/14/2023 105    CO2 06/14/2023 25    Glucose, Bld 06/14/2023 104 (H)    BUN 06/14/2023 15    Creatinine, Ser 06/14/2023 0.91    Total Bilirubin 06/14/2023 0.8    Alkaline Phosphatase 06/14/2023 67    AST 06/14/2023 21    ALT 06/14/2023 39    Total Protein 06/14/2023 7.1    Albumin 06/14/2023 4.7    GFR 06/14/2023 98.22    Calcium 06/14/2023 9.5    Cholesterol 06/14/2023 199    Triglycerides 06/14/2023 215.0 (H)    HDL 06/14/2023 43.40    VLDL 06/14/2023 43.0 (H)    LDL Cholesterol 06/14/2023 112 (H)    Total CHOL/HDL Ratio 06/14/2023 5    NonHDL 06/14/2023 155.47    TSH 06/14/2023 0.94    Hgb A1c MFr Bld 06/14/2023 6.1    PSA 06/14/2023 1.30    Free T4 06/14/2023 0.70   No image results found. No results found.    Results LABS Hematocrit: 46%    Assessment & Plan Hypogonadism in male Hypogonadism (E29.1) Laboratory-confirmed hypogonadism with total testosterone consistently below reference range (249.73 ng/dL most recently,  prior values 198.17 ng/dL and 161.09 ng/dL) and positive symptom burden demonstrated by 9/10 positive responses on ADAM questionnaire. Net  benefit/risk score is favorable at 5+ (6 benefit factors, 0-1 risk factors). Symptoms significantly impact quality of life and daily function.   Hypogonadism Management Diagnostic Completion: Order LH and FSH to determine primary vs. secondary hypogonadism etiology. Testosterone Replacement: Initiated AndroGel 1.62% pump, 2 pumps (40.5 mg) daily applied to clean, dry skin of shoulders, upper arms, or abdomen in the morning. Monitoring Protocol:  Total testosterone level in 4-6 weeks, 2-4 hours after morning application CBC to monitor hematocrit at 3 months PSA monitoring (currently normal at 1.3) in 3 months Liver function monitoring with comprehensive metabolic panel in 3 months Patient Education: Detailed instructions provided on gel application, precautions regarding transfer to women and children, and expected benefits/side effects.   Dyslipidemia Cardiometabolic Health Lifestyle modifications emphasized to address dyslipidemia and prediabetes: Mediterranean diet pattern, reduced refined carbohydrates, increased physical activity. Recommend gradual return to exercise program with goal of 150 minutes weekly of moderate activity plus resistance training 2-3 times weekly. Anticipate potential improvement in lipid profile and glycemic control with TRT. Reassess lipids and HbA1c in 6 months.   Prediabetes Lab Results  Component Value Date   HGBA1C 6.1 06/14/2023   \Advised patient this will likely improve with treatment/exercise OSA (obstructive sleep apnea) Suspected Sleep Apnea Patient reports restless sleep and symptoms suggestive of possible sleep-disordered breathing, which requires monitoring especially with initiation of TRT that may exacerbate underlying sleep apnea.     Sleep Apnea Evaluation Patient will use spouse's Apple Watch to track sleep patterns for objective data on potential sleep disturbances. Formal sleep study to be considered based on continued symptoms or if TRT  appears to worsen sleep quality. Sleep hygiene recommendations provided, including consistent bedtime, sleep environment optimization, and screen time limitations.   Anxiety Anxiety Management Continue current anxiety medication with monitoring for changes as testosterone levels normalize. Discussed potential for testosterone therapy to impact mood, with possibility of improved mood stability but also monitoring for irritability. Assess need for adjustment of anxiety management approach at follow-up visits.     WARNING SIGNS TO MONITOR: Patient instructed to report any of the following: sleep apnea symptoms (worsening daytime sleepiness, morning headaches, observed apneas), breast tenderness or enlargement, excessive acne, mood swings/aggression, excessive fluid retention, or priapism.  Follow-up: Patient to return in 6 weeks for testosterone level check and symptom assessment, with labs to be drawn 2-4 hours after morning application. Comprehensive follow-up with repeat labs in 3 months. Shared Decision Making: Risks, benefits, and alternatives to testosterone therapy thoroughly discussed. Patient understands the need for ongoing monitoring and the importance of reporting any adverse effects. Patient expressed understanding and agreement with the treatment plan.       Orders Placed During this Encounter:   Orders Placed This Encounter  Procedures   TSH+FSH+TestT+LH+T3+DHEA-S+...   Meds ordered this encounter  Medications   Testosterone (ANDROGEL) 20.25 MG/1.25GM (1.62%) GEL    Sig: Place 40.5 mg onto the skin daily at 6 (six) AM. Apply 1 pump press to one upper arm and shoulder and then apply 1 pump press to the opposite upper arm and shoulder once daily in the morning.  Apply after showering as directed Do not allow for contact with male kids or pregnant women    Dispense:  75 g    Refill:  2    Quantity: 1 x 75 g pump bottle     Days: 30  Additional Info: This encounter  employed state-of-the-art, real-time, collaborative documentation. The patient actively reviewed and assisted in updating their electronic medical record on a shared screen, ensuring transparency and facilitating joint problem-solving for the problem list, overview, and plan. This approach promotes accurate, informed care. The treatment plan was discussed and reviewed in detail, including medication safety, potential side effects, and all patient questions. We confirmed understanding and comfort with the plan. Follow-up instructions were established, including contacting the office for any concerns, returning if symptoms worsen, persist, or new symptoms develop, and precautions for potential emergency department visits.   Medical Decision Making (MDM) Complexity: Number and Complexity of Problems Addressed: Primary diagnosis of hypogonadism with significant symptoms impacting quality of life. Secondary concerns include anxiety, sleep disturbances suggestive of obstructive sleep apnea, prediabetes, and dyslipidemia. Data Reviewed and Analyzed: Multiple laboratory results, including three testosterone levels, lipid profile, HbA1c, and PSA. Initiation of additional testing (LH, FSH) to determine the etiology of hypogonadism. Risk of Complications and/or Morbidity: Initiation of testosterone replacement therapy (TRT) with potential risks such as exacerbation of sleep apnea, erythrocytosis, and impact on mood disorders. Comprehensive patient education provided regarding TRT risks, benefits, and monitoring requirements.   This document was synthesized by artificial intelligence (Abridge) using HIPAA-compliant recording of the clinical interaction;   We discussed the use of AI scribe software for clinical note transcription with the patient, who gave verbal consent to proceed.

## 2023-10-26 ENCOUNTER — Encounter: Payer: Self-pay | Admitting: Internal Medicine

## 2023-10-27 LAB — TSH+FSH+TESTT+LH+T3+DHEA-S+...
DHEA-SO4: 83 ug/dL (ref 71.6–375.4)
Estradiol: 23 pg/mL (ref 7.6–42.6)
FSH: 5.2 m[IU]/mL (ref 1.5–12.4)
LH: 3.7 m[IU]/mL (ref 1.7–8.6)
Progesterone: 0.1 ng/mL (ref 0.0–0.5)
T3, Total: 120 ng/dL (ref 71–180)
TSH: 0.835 u[IU]/mL (ref 0.450–4.500)
Testosterone, Free: 4.8 pg/mL — ABNORMAL LOW (ref 7.2–24.0)
Testosterone: 315 ng/dL (ref 264–916)

## 2023-10-27 NOTE — Telephone Encounter (Signed)
 read by Amelia Jurist at 7:32PM on 10/26/2023

## 2023-10-28 NOTE — Telephone Encounter (Signed)
 Please see pt msg and advise on PA status

## 2023-10-29 ENCOUNTER — Telehealth: Payer: Self-pay

## 2023-10-29 ENCOUNTER — Other Ambulatory Visit (HOSPITAL_COMMUNITY): Payer: Self-pay

## 2023-10-29 NOTE — Telephone Encounter (Signed)
 Pharmacy Patient Advocate Encounter  Received notification from [facility] that Prior Authorization for Testosterone  20.25 MG/1.25GM(1.62%) ge has been DENIED.  Full denial letter will be uploaded to the media tab. See denial reason below.   PA #/Case ID/Reference #:  [id number]  We have denied your request because your plan does not cover this drug for age-related hypogonadism, also referred to as late-onset hypogonadism. We reviewed the information we had. Your request has been denied. Your doctor can send us  any new or missing information for us  to review. For this drug, you may have to meet other criteria. You can request the drug policy for more details. You can also request other plan documents for your review.

## 2023-10-29 NOTE — Telephone Encounter (Signed)
 Pharmacy Patient Advocate Encounter   Received notification from Pt Calls Messages that prior authorization for Testosterone  20.25 MG/1.25GM(1.62%) gel is required/requested.   Insurance verification completed.   The patient is insured through [facility] .   Per test claim: PA required; PA submitted to above mentioned insurance via CoverMyMeds Key/confirmation #/EOC [id number] Status is pending

## 2023-11-02 NOTE — Telephone Encounter (Signed)
 MyChart secure digital messaging clinical encounter  Chief Complaint:  Insurance denial of testosterone  replacement therapy for documented hypogonadism  Relevant History: - 53 year old male with symptomatic hypogonadism diagnosed on 10/25/2023 - Multiple low testosterone  levels: 198.17 ng/dL (95/62/1308), 657.84 ng/dL (69/62/9528), 413.24 ng/dL (40/04/2724) - ADAM questionnaire with 9/10 positive responses - Symptoms include decreased libido, lack of energy, decreased strength, mood changes - Prescribed AndroGel  1.62% pump, 2 pumps (40.5 mg) daily - Recent labs ordered for LH/FSH pending - Relevant comorbidities: anxiety, prediabetes (HbA1c 6.1%), dyslipidemia - Normal baseline hematocrit (46.4%)  Assessment: Hypogonadism in male (E29.1) with insurance denial of prescribed testosterone  replacement therapy. Patient has well-documented biochemical and symptomatic hypogonadism with testosterone  levels consistently below reference range and significant impact on quality of life. Favorable benefit/risk assessment for testosterone  replacement therapy with minimal risk factors identified. Insurance denial creates barrier to appropriate medical care for this condition.  Plan: 1. Therapist, nutritional Strategy:    - Advised patient to obtain specific reason for denial from insurance company    - Will prepare supporting clinical documentation once denial reason is clarified    - Discussed inclusion of multiple low testosterone  levels, ADAM questionnaire results, and symptom documentation in appeal    - Referred to Endocrine Society Guidelines (2018) supporting testosterone  therapy for men with documented low testosterone  and symptoms  2. Alternative Treatment Options:    - Discussed potential cost-saving alternatives including discount programs (GoodRx), manufacturer assistance    - Reviewed pros/cons of alternative testosterone  formulations including generic gels and injectable options if appeal  unsuccessful    - Considered testosterone  cypionate injections as more affordable alternative if necessary ($23-40/month vs. $300-400/month for branded gels)  3. Education Provided:    - [employer] appeal process including rights to multiple levels of appeal    - Instructed on documentation needed for successful appeal process    - Discussed importance of completing ordered lab work to support appeal    - Warned about signs/symptoms requiring immediate medical attention during this process  4. Follow-up Planning:    - Patient to contact office with specific denial reason when received    - Will prepare supporting documentation for appeal once details known    - Continue with monitoring plan as previously documented including testosterone  level check in 6 weeks    - Consider medication adjustment based on appeal outcome  Medical Decision Making: Moderate complexity due to: - Multiple diagnostic variables (varied testosterone  levels, comprehensive symptom assessment) - Interpretation of conflicting data (insurance denial despite clear medical indication) - Significant risk of untreated hypogonadism affecting multiple body systems - Need to develop alternative treatment strategies while addressing insurance barriers - Analysis of medical necessity according to clinical guidelines vs. insurance requirements  Current Medical Guidelines: The 2018 Endocrine Society Clinical Practice Guidelines recommend testosterone  therapy for men with symptoms and signs consistent with testosterone  deficiency and unequivocally and consistently low serum testosterone  levels. Patient meets these criteria with multiple documented low testosterone  levels and positive ADAM questionnaire.  Please see the MyChart message reply(ies) for my assessment and plan.   This patient gave consent for this Medical Advice Message and is aware that it may result in a bill to [employer], as well as the  possibility of receiving a bill for a co-payment or deductible. They are an established patient, but are not seeking medical advice exclusively about a problem treated during an in person or video visit in the last seven days. I did not recommend an in person  or video visit within seven days of my reply.    I spent a total of 8  minutes cumulative time within 7 days through [employer]. Anthon Kins, MD

## 2023-11-03 NOTE — Telephone Encounter (Signed)
 Please see patient message and response to denial and advise other options/recommendations

## 2023-11-26 ENCOUNTER — Ambulatory Visit: Admitting: Internal Medicine

## 2023-11-29 ENCOUNTER — Encounter: Payer: Self-pay | Admitting: Physician Assistant

## 2023-12-07 ENCOUNTER — Encounter: Payer: Self-pay | Admitting: Gastroenterology

## 2023-12-17 ENCOUNTER — Ambulatory Visit

## 2023-12-17 VITALS — Ht 70.0 in | Wt 192.0 lb

## 2023-12-17 DIAGNOSIS — Z1211 Encounter for screening for malignant neoplasm of colon: Secondary | ICD-10-CM

## 2023-12-17 MED ORDER — NA SULFATE-K SULFATE-MG SULF 17.5-3.13-1.6 GM/177ML PO SOLN: 1.0000 | Freq: Once | ORAL | 0 refills | Status: AC

## 2023-12-17 NOTE — Progress Notes (Signed)
No egg or soy allergy known to patient  No issues known to pt with past sedation with any surgeries or procedures Patient denies ever being told they had issues or difficulty with intubation  No FH of Malignant Hyperthermia Pt is not on diet pills Pt is not on home 02  Pt is not on blood thinners  Pt denies issues with chronic constipation  No A fib or A flutter Have any cardiac testing pending--no Pt instructed to use Singlecare.com or GoodRx for a price reduction on prep  Ambulates independently

## 2023-12-24 ENCOUNTER — Encounter: Payer: Self-pay | Admitting: Gastroenterology

## 2024-01-07 ENCOUNTER — Ambulatory Visit (AMBULATORY_SURGERY_CENTER): Admitting: Gastroenterology

## 2024-01-07 ENCOUNTER — Encounter: Payer: Self-pay | Admitting: Gastroenterology

## 2024-01-07 VITALS — BP 124/82 | HR 64 | Temp 98.1°F | Resp 16 | Ht 70.0 in | Wt 192.0 lb

## 2024-01-07 DIAGNOSIS — D122 Benign neoplasm of ascending colon: Secondary | ICD-10-CM

## 2024-01-07 DIAGNOSIS — D124 Benign neoplasm of descending colon: Secondary | ICD-10-CM

## 2024-01-07 DIAGNOSIS — Z1211 Encounter for screening for malignant neoplasm of colon: Secondary | ICD-10-CM

## 2024-01-07 DIAGNOSIS — K641 Second degree hemorrhoids: Secondary | ICD-10-CM

## 2024-01-07 DIAGNOSIS — K644 Residual hemorrhoidal skin tags: Secondary | ICD-10-CM | POA: Diagnosis not present

## 2024-01-07 DIAGNOSIS — K635 Polyp of colon: Secondary | ICD-10-CM

## 2024-01-07 DIAGNOSIS — D123 Benign neoplasm of transverse colon: Secondary | ICD-10-CM

## 2024-01-07 DIAGNOSIS — D125 Benign neoplasm of sigmoid colon: Secondary | ICD-10-CM

## 2024-01-07 MED ORDER — SODIUM CHLORIDE 0.9 % IV SOLN: 500.0000 mL | Freq: Once | INTRAVENOUS | Status: DC

## 2024-01-07 NOTE — Op Note (Signed)
 [facility] Patient Name: Timothy Lester Procedure Date: 01/07/2024 4:12 PM MRN: [id number] Endoscopist: Sandor Flatter , MD, [id number] Age: 53 Referring MD:  Date of Birth: 12/01/1970 Gender: Male Account #: [id number] Procedure:                Colonoscopy Indications:              Screening for colorectal malignant neoplasm, This                            is the patient's first colonoscopy Medicines:                Monitored Anesthesia Care Procedure:                Pre-Anesthesia Assessment:                           - Prior to the procedure, a History and Physical                            was performed, and patient medications and                            allergies were reviewed. The patient's tolerance of                            previous anesthesia was also reviewed. The risks                            and benefits of the procedure and the sedation                            options and risks were discussed with the patient.                            All questions were answered, and informed consent                            was obtained. Prior Anticoagulants: The patient has                            taken no anticoagulant or antiplatelet agents. ASA                            Grade Assessment: II - A patient with mild systemic                            disease. After reviewing the risks and benefits,                            the patient was deemed in satisfactory condition to                            undergo the procedure.  After obtaining informed consent, the colonoscope                            was passed under direct vision. Throughout the                            procedure, the patient's blood pressure, pulse, and                            oxygen saturations were monitored continuously. The                            Olympus Scope SN: [id number] was introduced through                            the anus and advanced to  the the cecum, identified                            by appendiceal orifice and ileocecal valve. The                            colonoscopy was performed without difficulty. The                            patient tolerated the procedure well. The quality                            of the bowel preparation was good. The ileocecal                            valve, appendiceal orifice, and rectum were                            photographed. Scope In: 4:17:26 PM Scope Out: 4:38:39 PM Scope Withdrawal Time: 0 hours 18 minutes 49 seconds  Total Procedure Duration: 0 hours 21 minutes 13 seconds  Findings:                 Hemorrhoids were found on perianal exam.                           An 8 mm polyp was found in the ascending colon. The                            polyp was mucous-capped and semi-sessile. The polyp                            was removed with a cold snare. Resection and                            retrieval were complete. Estimated blood loss was                            minimal.  Six sessile polyps were found in the descending                            colon, transverse colon and ascending colon. The                            polyps were 3 to 6 mm in size. These polyps were                            removed with a cold snare. Resection and retrieval                            were complete. Estimated blood loss was minimal.                           A 4 mm polyp was found in the sigmoid colon. The                            polyp was sessile. The polyp was removed with a                            cold snare. Resection and retrieval were complete.                            Estimated blood loss was minimal.                           Non-bleeding internal hemorrhoids were found during                            retroflexion. The hemorrhoids were small and Grade                            II (internal hemorrhoids that prolapse but reduce                             spontaneously). Complications:            No immediate complications. Estimated Blood Loss:     Estimated blood loss was minimal. Impression:               - Hemorrhoids found on perianal exam.                           - One 8 mm polyp in the ascending colon, removed                            with a cold snare. Resected and retrieved.                           - Six 3 to 6 mm polyps in the descending colon, in  the transverse colon and in the ascending colon,                            removed with a cold snare. Resected and retrieved.                           - One 4 mm polyp in the sigmoid colon, removed with                            a cold snare. Resected and retrieved.                           - Non-bleeding internal hemorrhoids. Recommendation:           - Patient has a contact number available for                            emergencies. The signs and symptoms of potential                            delayed complications were discussed with the                            patient. Return to normal activities tomorrow.                            Written discharge instructions were provided to the                            patient.                           - Resume previous diet.                           - Continue present medications.                           - Await pathology results.                           - Repeat colonoscopy in 3 years for surveillance                            based on pathology results.                           - Return to GI clinic PRN. Sandor Flatter, MD 01/07/2024 4:43:32 PM

## 2024-01-07 NOTE — Progress Notes (Unsigned)
 Vitals-AH  Pt's states no medical or surgical changes since previsit or office visit.

## 2024-01-07 NOTE — Progress Notes (Unsigned)
 GASTROENTEROLOGY PROCEDURE H&P NOTE   Primary Care Physician: Allwardt, Mardy HERO, PA-C    Reason for Procedure:  Colon Cancer screening  Plan:    Colonoscopy  Patient is appropriate for endoscopic procedure(s) in the ambulatory ([facility]) setting.  The nature of the procedure, as well as the risks, benefits, and alternatives were carefully and thoroughly reviewed with the patient. Ample time for discussion and questions allowed. The patient understood, was satisfied, and agreed to proceed.     HPI: Timothy Lester is a 53 y.o. male who presents for colonoscopy for routine Colon Cancer screening.  No active GI symptoms.  No known family history of colon cancer or related malignancy.  Patient is otherwise without complaints or active issues today.  Past Medical History:  Diagnosis Date   Anxiety    Depression    Kidney stone    PONV (postoperative nausea and vomiting)    as a kid     Past Surgical History:  Procedure Laterality Date   CHOLECYSTECTOMY N/A 09/30/2020   Procedure: LAPAROSCOPIC CHOLECYSTECTOMY WITH  INTRAOPERATIVE CHOLANGIOGRAM;  Surgeon: Tanda Locus, MD;  Location: [facility];  Service: General;  Laterality: N/A;  90 MIN   ERCP N/A 10/19/2013   Procedure: ENDOSCOPIC RETROGRADE CHOLANGIOPANCREATOGRAPHY (ERCP);  Surgeon: Norleen JAYSON Hint, MD;  Location: [facility];  Service: Endoscopy;  Laterality: N/A;   EUS N/A 10/26/2013   Procedure: ESOPHAGEAL ENDOSCOPIC ULTRASOUND (EUS) RADIAL;  Surgeon: Elsie Cree, MD;  Location: [facility];  Service: Endoscopy;  Laterality: N/A;   EYE SURGERY     Lasik   FEMUR SURGERY Left 1994   FRACTURE SURGERY Left    femur - age 26 - titanium rod    Prior to Admission medications   Medication Sig Start Date End Date Taking? Authorizing Provider  escitalopram  (LEXAPRO ) 20 MG tablet Take 1 tablet (20 mg total) by mouth daily. 10/11/23   Allwardt, Alyssa M, PA-C  ibuprofen (ADVIL) 200 MG tablet Take 400-600 mg by mouth every 8 (eight)  hours as needed for moderate pain (pain score 4-6).    [provider]  Testosterone  (ANDROGEL ) 20.25 MG/1.25GM (1.62%) GEL Place 40.5 mg onto the skin daily at 6 (six) AM. Apply 1 pump press to one upper arm and shoulder and then apply 1 pump press to the opposite upper arm and shoulder once daily in the morning.  Apply after showering as directed Do not allow for contact with male kids or pregnant women 10/25/23   Jesus Bernardino MATSU, MD    Current Outpatient Medications  Medication Sig Dispense Refill   escitalopram  (LEXAPRO ) 20 MG tablet Take 1 tablet (20 mg total) by mouth daily. 90 tablet 1   ibuprofen (ADVIL) 200 MG tablet Take 400-600 mg by mouth every 8 (eight) hours as needed for moderate pain (pain score 4-6).     Testosterone  (ANDROGEL ) 20.25 MG/1.25GM (1.62%) GEL Place 40.5 mg onto the skin daily at 6 (six) AM. Apply 1 pump press to one upper arm and shoulder and then apply 1 pump press to the opposite upper arm and shoulder once daily in the morning.  Apply after showering as directed Do not allow for contact with male kids or pregnant women 75 g 2   No current facility-administered medications for this visit.    Allergies as of 01/07/2024 - Review Complete 01/07/2024  Allergen Reaction Noted   Octacosanol Other (See Comments) 06/14/2023    Family History  Problem Relation Age of Onset   Anxiety disorder Mother  COPD Mother    Heart attack Mother    Pancreatic cancer Father    ADD / ADHD Brother        medical doctor   Depression Brother    ADD / ADHD Brother    Diabetes Paternal Grandmother    Stroke Paternal Grandfather    Alcoholism Paternal Grandfather    Colon cancer Neg Hx    Esophageal cancer Neg Hx    Stomach cancer Neg Hx    Rectal cancer Neg Hx    Colon polyps Neg Hx     Social History   Socioeconomic History   Marital status: Married    Spouse name: Not on file   Number of children: Not on file   Years of education: Not on file    Highest education level: 12th grade  Occupational History   Not on file  Tobacco Use   Smoking status: Never   Smokeless tobacco: Never  Vaping Use   Vaping status: Never Used  Substance and Sexual Activity   Alcohol use: No   Drug use: No   Sexual activity: Yes    Birth control/protection: None  Other Topics Concern   Not on file  Social History Narrative   Not on file   Social Drivers of Health   Financial Resource Strain: Low Risk  (10/10/2023)   Overall Financial Resource Strain (CARDIA)    Difficulty of Paying Living Expenses: Not hard at all  Food Insecurity: No Food Insecurity (10/10/2023)   Hunger Vital Sign    Worried About Running Out of Food in the Last Year: Never true    Ran Out of Food in the Last Year: Never true  Transportation Needs: No Transportation Needs (10/10/2023)   PRAPARE - Administrator, Civil Service (Medical): No    Lack of Transportation (Non-Medical): No  Physical Activity: Unknown (10/10/2023)   Exercise Vital Sign    Days of Exercise per Week: 0 days    Minutes of Exercise per Session: Not on file  Stress: Stress Concern Present (10/10/2023)   [employer] of Occupational Health - Occupational Stress Questionnaire    Feeling of Stress : Rather much  Social Connections: Socially Integrated (10/10/2023)   Social Connection and Isolation Panel    Frequency of Communication with Friends and Family: Twice a week    Frequency of Social Gatherings with Friends and Family: Once a week    Attends Religious Services: More than 4 times per year    Active Member of [employer] or Organizations: Yes    Attends Engineer, structural: More than 4 times per year    Marital Status: Married  Catering manager Violence: Not on file    Physical Exam: Vital signs in last 24 hours: @There  were no vitals taken for this visit. GEN: NAD EYE: Sclerae anicteric ENT: MMM CV: Non-tachycardic Pulm: CTA b/l GI: Soft, NT/ND NEURO:  Alert &  Oriented x 3   Sandor Flatter, DO [facility]   01/07/2024 3:01 PM

## 2024-01-07 NOTE — Patient Instructions (Signed)

## 2024-01-07 NOTE — Progress Notes (Unsigned)
 Called to room to assist during endoscopic procedure.  Patient ID and intended procedure confirmed with present staff. Received instructions for my participation in the procedure from the performing physician.

## 2024-01-07 NOTE — Progress Notes (Unsigned)
 Vss nad trans to pacu

## 2024-01-10 ENCOUNTER — Telehealth: Payer: Self-pay

## 2024-01-10 NOTE — Telephone Encounter (Signed)
  Follow up Call-     01/07/2024    3:26 PM 01/07/2024    3:17 PM  Call back number  Post procedure Call Back phone  # [phone]   Permission to leave phone message  Yes     Patient questions:  Do you have a fever, pain , or abdominal swelling? No. Pain Score  0 *  Have you tolerated food without any problems? yes  Have you been able to return to your normal activities? yes  Do you have any questions about your discharge instructions: Diet   No. Medications  No. Follow up visit  No.  Do you have questions or concerns about your Care? No.  Actions: * If pain score is 4 or above: No action needed, pain <4.

## 2024-01-13 LAB — SURGICAL PATHOLOGY

## 2024-01-17 ENCOUNTER — Ambulatory Visit: Payer: Self-pay | Admitting: Gastroenterology

## 2024-01-24 ENCOUNTER — Encounter: Payer: Self-pay | Admitting: Physician Assistant

## 2024-01-24 ENCOUNTER — Ambulatory Visit (INDEPENDENT_AMBULATORY_CARE_PROVIDER_SITE_OTHER): Payer: BC Managed Care – PPO | Admitting: Physician Assistant

## 2024-01-24 VITALS — BP 124/84 | HR 62 | Temp 98.1°F | Ht 70.0 in | Wt 205.4 lb

## 2024-01-24 DIAGNOSIS — F419 Anxiety disorder, unspecified: Secondary | ICD-10-CM | POA: Diagnosis not present

## 2024-01-24 DIAGNOSIS — Z860100 Personal history of colon polyps, unspecified: Secondary | ICD-10-CM | POA: Diagnosis not present

## 2024-01-24 DIAGNOSIS — R7989 Other specified abnormal findings of blood chemistry: Secondary | ICD-10-CM | POA: Diagnosis not present

## 2024-01-24 DIAGNOSIS — K8020 Calculus of gallbladder without cholecystitis without obstruction: Secondary | ICD-10-CM | POA: Insufficient documentation

## 2024-01-24 MED ORDER — ESCITALOPRAM OXALATE 20 MG PO TABS: 20.0000 mg | ORAL_TABLET | Freq: Every day | ORAL | 1 refills | Status: AC

## 2024-01-24 NOTE — Progress Notes (Signed)
 Patient ID: Timothy Lester, male    DOB: [birth year], 53 y.o.   MRN: [id number]   Assessment & Plan:  Personal history of colon polyps, unspecified  Anxiety -     Escitalopram  Oxalate; Take 1 tablet (20 mg total) by mouth daily.  Dispense: 90 tablet; Refill: 1  Low testosterone  in male      Assessment and Plan Assessment & Plan Colonic Polyps Colonoscopy revealed multiple polyps: one 8mm polyp in the ascending colon, six 3-66mm polyps in the descending colon, and one 4mm polyp in the sigmoid colon. Family history of polyps but no colorectal cancer. Emphasized importance of surveillance. - Schedule follow-up colonoscopy in three years.  Depression Currently on Lexapro , which is effective without causing increased depression or suicidal ideation. Reports feeling more relaxed and having more energy, though there are periods of decreased motivation. Discussed potential for Lexapro 's efficacy to decrease over time, necessitating a possible switch in medication. - Continue Lexapro  as prescribed. - Monitor for changes in mood or efficacy of medication.  Testosterone  Deficiency Receiving testosterone  therapy, initially providing noticeable benefits in energy levels. Reports decreased motivation for activities such as running. Experienced issues with insurance coverage but managed to obtain it at a reasonable cost. Suggested exploring other providers for testosterone  management if needed. - Continue current testosterone  therapy. - Consider exploring other providers for testosterone  management if needed.  General Health Maintenance Up to date with general health maintenance, including recent labs and colonoscopy. Plans to reassess cholesterol and other health markers during the next physical exam. - Schedule physical exam in December. - Perform fasting labs during December physical exam.      Return in about 5 months (around 06/25/2024) for physical, fasting labs .    Subjective:     Chief Complaint  Patient presents with   Medical Management of Chronic Issues    Pt in for 6 mon f/u for anxiety and being followed by Jesus for low testosterone .      HPI Discussed the use of AI scribe software for clinical note transcription with the patient, who gave verbal consent to proceed.  History of Present Illness Timothy Lester is a 53 year old male who presents for a follow-up regarding his medication management and recent colonoscopy results.  He has been taking Lexapro , which he finds effective in helping him feel more relaxed and energetic. However, he experiences a lack of motivation to run as he used to. He occasionally forgets to take his dose but notices when he does. No feelings of depression or adverse effects from the medication.  He recently underwent a colonoscopy where eight polyps were found: one 8mm polyp in the ascending colon, six 3-31mm polyps in the descending colon, and one 4mm polyp in the sigmoid colon. He found the preparation for the procedure unpleasant, particularly the liquid solution, and would have preferred pills. He had to fast for 47 hours before the procedure, which he found challenging. His family history includes his father having had polyps and passing away from pancreatic cancer. His brother had a colonoscopy with two to three polyps found, while another brother uses Cologuard for screening.  He is currently using testosterone  gel, which he finds beneficial, although he initially faced insurance coverage issues. He reports feeling more energetic since starting the treatment.  No skin changes, rashes, or moles.     Past Medical History:  Diagnosis Date   Anxiety    Depression    Kidney stone    PONV (  postoperative nausea and vomiting)    as a kid     Past Surgical History:  Procedure Laterality Date   CHOLECYSTECTOMY N/A 09/30/2020   Procedure: LAPAROSCOPIC CHOLECYSTECTOMY WITH  INTRAOPERATIVE CHOLANGIOGRAM;  Surgeon: Tanda Locus, MD;  Location: [facility];  Service: General;  Laterality: N/A;  90 MIN   ERCP N/A 10/19/2013   Procedure: ENDOSCOPIC RETROGRADE CHOLANGIOPANCREATOGRAPHY (ERCP);  Surgeon: Norleen JAYSON Hint, MD;  Location: [facility];  Service: Endoscopy;  Laterality: N/A;   EUS N/A 10/26/2013   Procedure: ESOPHAGEAL ENDOSCOPIC ULTRASOUND (EUS) RADIAL;  Surgeon: Elsie Cree, MD;  Location[facility];  Service: Endoscopy;  Laterality: N/A;   EYE SURGERY     Lasik   FEMUR SURGERY Left 1994   FRACTURE SURGERY Left    femur - age 17 - titanium rod    Family History  Problem Relation Age of Onset   Anxiety disorder Mother    COPD Mother    Heart attack Mother    Pancreatic cancer Father    ADD / ADHD Brother        medical doctor   Depression Brother    ADD / ADHD Brother    Diabetes Paternal Grandmother    Stroke Paternal Grandfather    Alcoholism Paternal Grandfather    Colon cancer Neg Hx    Esophageal cancer Neg Hx    Stomach cancer Neg Hx    Rectal cancer Neg Hx    Colon polyps Neg Hx     Social History   Tobacco Use   Smoking status: Never   Smokeless tobacco: Never  Vaping Use   Vaping status: Never Used  Substance Use Topics   Alcohol use: No   Drug use: No     Allergies  Allergen Reactions   Octacosanol Other (See Comments)    Review of Systems NEGATIVE UNLESS OTHERWISE INDICATED IN HPI      Objective:     BP 124/84 (BP Location: Left Arm, Patient Position: Sitting, Cuff Size: Normal)   Pulse 62   Temp 98.1 F (36.7 C) (Temporal)   Ht 5' 10 (1.778 m)   Wt 205 lb 6.4 oz (93.2 kg)   SpO2 96%   BMI 29.47 kg/m   Wt Readings from Last 3 Encounters:  01/24/24 205 lb 6.4 oz (93.2 kg)  01/07/24 192 lb (87.1 kg)  12/17/23 192 lb (87.1 kg)    BP Readings from Last 3 Encounters:  01/24/24 124/84  01/07/24 124/82  10/25/23 118/74     Physical Exam Vitals and nursing note reviewed.  Constitutional:      Appearance: Normal appearance.  Eyes:      Extraocular Movements: Extraocular movements intact.     Conjunctiva/sclera: Conjunctivae normal.     Pupils: Pupils are equal, round, and reactive to light.  Cardiovascular:     Rate and Rhythm: Normal rate and regular rhythm.  Pulmonary:     Effort: Pulmonary effort is normal.     Breath sounds: Normal breath sounds.  Neurological:     General: No focal deficit present.     Mental Status: He is alert.  Psychiatric:        Mood and Affect: Mood normal.             Raybon Conard M Stormy Connon, PA-C

## 2024-02-22 ENCOUNTER — Encounter: Payer: Self-pay | Admitting: Internal Medicine

## 2024-02-22 ENCOUNTER — Other Ambulatory Visit: Payer: Self-pay | Admitting: Internal Medicine

## 2024-02-22 DIAGNOSIS — E291 Testicular hypofunction: Secondary | ICD-10-CM

## 2024-02-22 NOTE — Telephone Encounter (Signed)
  Clinical Note for Internal Use/Documentation: Per Endocrine Society, AUA, and ACP guidelines, testosterone  therapy requires periodic in-person assessment, symptom review, and lab monitoring (testosterone , hematocrit) at 3-12 months after initiation and annually thereafter. Refill should not be provided without clinical assessment and laboratory review.

## 2024-02-23 MED ORDER — TESTOSTERONE 20.25 MG/ACT (1.62%) TD GEL: 2.0000 | Freq: Every day | TRANSDERMAL | 0 refills | Status: DC

## 2024-02-23 NOTE — Addendum Note (Signed)
 Addended by: Graeme Menees G on: 02/23/2024 08:44 PM   Modules accepted: Orders

## 2024-02-24 ENCOUNTER — Other Ambulatory Visit (HOSPITAL_COMMUNITY): Payer: Self-pay

## 2024-03-07 ENCOUNTER — Encounter (HOSPITAL_BASED_OUTPATIENT_CLINIC_OR_DEPARTMENT_OTHER): Payer: Self-pay | Admitting: Emergency Medicine

## 2024-03-07 ENCOUNTER — Emergency Department (HOSPITAL_BASED_OUTPATIENT_CLINIC_OR_DEPARTMENT_OTHER): Admitting: Radiology

## 2024-03-07 ENCOUNTER — Other Ambulatory Visit: Payer: Self-pay

## 2024-03-07 ENCOUNTER — Emergency Department (HOSPITAL_BASED_OUTPATIENT_CLINIC_OR_DEPARTMENT_OTHER)
Admission: EM | Admit: 2024-03-07 | Discharge: 2024-03-07 | Disposition: A | Discharge status: Home / Self Care | Attending: Emergency Medicine | Admitting: Emergency Medicine

## 2024-03-07 DIAGNOSIS — R0789 Other chest pain: Secondary | ICD-10-CM | POA: Insufficient documentation

## 2024-03-07 DIAGNOSIS — R079 Chest pain, unspecified: Secondary | ICD-10-CM | POA: Diagnosis not present

## 2024-03-07 DIAGNOSIS — R1013 Epigastric pain: Secondary | ICD-10-CM | POA: Diagnosis not present

## 2024-03-07 LAB — TROPONIN T, HIGH SENSITIVITY
Troponin T High Sensitivity: <15 ng/L (ref 0–19)
Troponin T High Sensitivity: <15 ng/L (ref 0–19)

## 2024-03-07 LAB — CBC
HCT: 45.5 % (ref 39.0–52.0)
Hemoglobin: 16.1 g/dL (ref 13.0–17.0)
MCH: 31.1 pg (ref 26.0–34.0)
MCHC: 35.4 g/dL (ref 30.0–36.0)
MCV: 88 fL (ref 80.0–100.0)
Platelets: 182 K/uL (ref 150–400)
RBC: 5.17 MIL/uL (ref 4.22–5.81)
RDW: 12.1 % (ref 11.5–15.5)
WBC: 5.7 K/uL (ref 4.0–10.5)
nRBC: 0 % (ref 0.0–0.2)

## 2024-03-07 LAB — BASIC METABOLIC PANEL WITH GFR
Anion gap: 12 (ref 5–15)
BUN: 12 mg/dL (ref 6–20)
CO2: 22 mmol/L (ref 22–32)
Calcium: 10.1 mg/dL (ref 8.9–10.3)
Chloride: 103 mmol/L (ref 98–111)
Creatinine, Ser: 1.09 mg/dL (ref 0.61–1.24)
GFR, Estimated: >60 mL/min (ref >60)
Glucose, Bld: 124 mg/dL — ABNORMAL HIGH (ref 70–99)
Potassium: 3.8 mmol/L (ref 3.5–5.1)
Sodium: 136 mmol/L (ref 135–145)

## 2024-03-07 LAB — D-DIMER, QUANTITATIVE: D-Dimer, Quant: 0.33 ug{FEU}/mL (ref 0.00–0.50)

## 2024-03-07 NOTE — ED Triage Notes (Signed)
 C/o central and epigastric CP that rads into back. Denies hx. + nausea.

## 2024-03-07 NOTE — Discharge Instructions (Signed)
 Timothy Lester  Thank you for allowing us  to take care of you today.  You came to the Emergency Department today because have had had intermittent pressure in your chest and nausea over the past several days.  Here in the emergency department we ruled out emergency causes of your symptoms such as a heart attack with 2 negative troponins (proteins that live in your heart that leak and your blood when you have damage to your heart), your blood clot risk factor number (D-dimer) is negative, your chest x-ray does not show any signs of pneumonia or collapsed lung, and your blood counts and blood salts are also reassuring.  Overall, we cannot say definitively what is causing your symptoms, however we have ruled out the emergency causes.  However given some characteristics of your episodes given that you had some tingling in your arm last week when on the treadmill, we do recommend following up with a cardiologist to be evaluated for any developing heart disease even in the absence of a heart attack today.  To-Do: 1. Please follow-up with your primary doctor within 1 month, and with cardiology within 2 weeks.  Please return to the Emergency Department or call 911 if you experience have worsening of your symptoms, or do not get better, new or different chest pain, shortness of breath, severe or significantly worsening pain, high fever, severe confusion, pass out or have any reason to think that you need emergency medical care.   We hope you feel better soon.   [name], MD Department of Emergency Medicine [facility]

## 2024-03-07 NOTE — ED Provider Notes (Signed)
 [facility] EMERG[facility]Y DEPARTMENT AT [facility] Provider Note   CSN: [id number] Arrival date & time: 03/07/24  0700     Patient presents with: Chest Pain   Timothy Lester is a 53 y.o. male with past medical history pertinent for prior cholecystectomy, OSA, prediabetes, hypercholesterolemia not on medication, anxiety, testosterone  replacement therapy who presents for evaluation of chest pain and abdominal discomfort.  Patient reports that last week he had episodes of left upper extremity tingling while running on the treadmill, which he initially attributed to a rotator cuff injury.  Reports that over the past several days he has also had intermittent episodes of chest tightness radiating to his epigastrium and back.  These are associated with mild shortness of breath, nausea without vomiting.  Denies diaphoresis, fever, chills, leg pain, leg swelling.  Reports family history of CAD after age 72 and his mother.  Denies any known history of CAD personally.  Patient is currently pain-free.  The history is provided by the patient. No language interpreter was used.       Prior to Admission medications   Medication Sig Start Date End Date Taking? Authorizing Provider  escitalopram  (LEXAPRO ) 20 MG tablet Take 1 tablet (20 mg total) by mouth daily. 01/24/24   Allwardt, Alyssa M, PA-C  ibuprofen (ADVIL) 200 MG tablet Take 400-600 mg by mouth every 8 (eight) hours as needed for moderate pain (pain score 4-6).    [provider]  Testosterone  20.25 MG/ACT (1.62%) GEL Apply 2 Pump topically daily at 6 (six) AM. 02/23/24   Jesus Bernardino MATSU, MD    Allergies: Octacosanol    Review of Systems as per HPI  Updated Vital Signs BP 135/84   Pulse 61   Temp 98.3 F (36.8 C) (Oral)   Resp 18   SpO2 98%   Physical Exam Vitals and nursing note reviewed.  Constitutional:      General: He is not in acute distress.    Appearance: He is well-developed.  HENT:     Head: Normocephalic and  atraumatic.  Eyes:     Conjunctiva/sclera: Conjunctivae normal.  Cardiovascular:     Rate and Rhythm: Normal rate and regular rhythm.     Pulses:          Radial pulses are 2+ on the right side and 2+ on the left side.     Heart sounds: No murmur heard. Pulmonary:     Effort: Pulmonary effort is normal. No respiratory distress.     Breath sounds: Normal breath sounds.  Abdominal:     Palpations: Abdomen is soft.     Tenderness: There is no abdominal tenderness.  Musculoskeletal:        General: No swelling.     Cervical back: Neck supple.     Right lower leg: No tenderness. No edema.     Left lower leg: No tenderness. No edema.  Skin:    General: Skin is warm and dry.     Capillary Refill: Capillary refill takes less than 2 seconds.  Neurological:     Mental Status: He is alert.  Psychiatric:        Mood and Affect: Mood normal.     (all labs ordered are listed, but only abnormal results are displayed) Labs Reviewed  BASIC METABOLIC PANEL WITH GFR - Abnormal; Notable for the following components:      Result Value   Glucose, Bld 124 (*)    All other components within normal limits  CBC  D-DIMER, QUANTITATIVE  TROPONIN T, HIGH SENSITIVITY  TROPONIN T, HIGH SENSITIVITY    EKG: EKG Interpretation Date/Time:  Tuesday March 07 2024 07:09:24 EDT Ventricular Rate:  89 PR Interval:  118 QRS Duration:  92 QT Interval:  368 QTC Calculation: 447 R Axis:   73  Text Interpretation: Normal sinus rhythm with sinus arrhythmia Nonspecific T wave abnormality in lead III No previous ECGs available Confirmed by Rogelia Satterfield ([id number]) on 03/07/2024 9:08:34 AM  Radiology: DG Chest 2 View Result Date: 03/07/2024 CLINICAL DATA:  Chest pain. EXAM: CHEST - 2 VIEW COMPARISON:  None Available. FINDINGS: The heart size and mediastinal contours are within normal limits. Both lungs are clear. The visualized skeletal structures are unremarkable. IMPRESSION: No active cardiopulmonary disease.  Electronically Signed   By: Lynwood Landy Raddle M.D.   On: 03/07/2024 07:54    Procedures   Medications Ordered in the ED - No data to display              HEART Score: 3                    Medical Decision Making Amount and/or Complexity of Data Reviewed Labs: ordered. Radiology: ordered.   Timothy Lester is a 53 y.o. male who presents for chest discomfort radiating to the abdomen as per above.  Physical exam is pertinent for no focal findings.   The differential includes but is not limited to ACS, arrhythmia, pericardial tamponade, pericarditis, myocarditis, pneumonia, pneumothorax, esophageal, tear, perforated abdominal viscous, pulmonary embolism, aortic dissection, costochondritis, musculoskeletal chest wall pain, GERD.  Independent historian: None  External data reviewed: None pertinent  Labs: Ordered, Independent interpretation, and All laboratory results reviewed without evidence of clinically relevant pathology    Radiology: Ordered, Independent interpretation, Details: Chest x-ray without focal airspace opacification, cardiomediastinal silhouette derangement, pneumothorax, pleural effusion, bony derangement, and All images reviewed independently.  Agree with radiology report at this time.    EKG/Medicine tests: Ordered, Independent interpretation, and Details documented in ED Course  Interventions: None  See the EMR for full details regarding lab and imaging results.  [name] is awake, alert, and HDS.  His exam is most notable for no focal findings.  The ECG reveals no anatomical ischemia representing STEMI, New-Onset Arrhythmia, or ischemic equivalent.  He has been risk stratified with a HEAR score of 3. Initial troponin is <15; delta troponin is <15.  The patient's presentation, the patient being hemodynamically stable, and the ECG are not consistent with Pericardial Tamponade. The patient's pain is not positional. This in conjunction with the lack of PR  depressions and ST elevations on the ECG are reassuring against Pericarditis. The patient's non-elevated troponin and ECG are also inconsistent with Myocarditis.  The CXR is unremarkable for focal airspace disease.  The patient is afebrile and denies productive cough.  Therefore, I do not suspect Pneumonia. There is no evidence of Pneumothorax on physical exam or on the CXR. CXR shows no evidence of Esophageal Tear and there is no recent intractable emesis or esophageal instrumentation. There is no peritonitis or free air on CXR worrisome for a Perforated Abdominal Viscous.  Pulmonary Embolism is on the differential. The patient is at  risk via the Revised Geneva Criteria. Therefore, we will further risk stratify the patient with a d-dimer.  This was WNL. Therefore, a CTA not indicated.  The patient's pain is not tearing and it does not radiate to back. Pulses are present bilaterally in both the upper and  lower extremities. CXR does not show a widened mediastinum. I have a very low suspicion for Aortic Dissection.  Patient remained pain-free while in the emergency department.  The above reassuring workup makes emergent etiology of symptoms unlikely.  Discussed this with patient at bedside.  Discussed that he does have cardiac risk factors, and symptoms could cardiac in origin even in absence of acute cardiac pathology today.  Discussed following up with cardiology, patient expressed understanding.  Referral placed for cardiology follow-up, patient amenable to this plan.  Presentation is most consistent with acute complicated illness.  Discussion of management or test interpretations with external provider(s): Not indicated  Risk Drugs:None Critical Care: N/A  Disposition: DISCHARGE: I believe that the patient is safe for discharge home with outpatient follow-up. Patient was informed of all pertinent physical exam, laboratory, and imaging findings.  Patient's suspected etiology of their symptom  presentation was discussed with the patient and all questions were answered. We discussed following up with PCP, cardiology. I provided thorough ED return precautions. The patient feels safe and comfortable with this plan.  MDM generated using voice dictation software and may contain dictation errors.  Please contact me for any clarification or with any questions.    Final diagnoses:  Chest pain, unspecified type    ED Discharge Orders          Ordered    Ambulatory referral to Cardiology       Comments: If you have not heard from the Cardiology office within the next 72 hours please call [phone].   03/07/24 1053                Rogelia Jerilynn RAMAN, MD 03/07/24 1206

## 2024-04-05 ENCOUNTER — Encounter: Payer: Self-pay | Admitting: Internal Medicine

## 2024-04-05 ENCOUNTER — Ambulatory Visit (INDEPENDENT_AMBULATORY_CARE_PROVIDER_SITE_OTHER): Admitting: Internal Medicine

## 2024-04-05 VITALS — BP 122/82 | HR 73 | Temp 98.2°F | Ht 70.0 in | Wt 206.2 lb

## 2024-04-05 DIAGNOSIS — G4733 Obstructive sleep apnea (adult) (pediatric): Secondary | ICD-10-CM | POA: Diagnosis not present

## 2024-04-05 DIAGNOSIS — E291 Testicular hypofunction: Secondary | ICD-10-CM | POA: Diagnosis not present

## 2024-04-05 DIAGNOSIS — E785 Hyperlipidemia, unspecified: Secondary | ICD-10-CM

## 2024-04-05 LAB — COMPREHENSIVE METABOLIC PANEL WITH GFR
ALT: 28 U/L (ref 0–53)
AST: 20 U/L (ref 0–37)
Albumin: 4.4 g/dL (ref 3.5–5.2)
Alkaline Phosphatase: 56 U/L (ref 39–117)
BUN: 16 mg/dL (ref 6–23)
CO2: 27 meq/L (ref 19–32)
Calcium: 9 mg/dL (ref 8.4–10.5)
Chloride: 103 meq/L (ref 96–112)
Creatinine, Ser: 0.98 mg/dL (ref 0.40–1.50)
GFR: 89.35 mL/min (ref >60.00)
Glucose, Bld: 91 mg/dL (ref 70–99)
Potassium: 4.1 meq/L (ref 3.5–5.1)
Sodium: 139 meq/L (ref 135–145)
Total Bilirubin: 0.6 mg/dL (ref 0.2–1.2)
Total Protein: 6.6 g/dL (ref 6.0–8.3)

## 2024-04-05 LAB — CBC WITH DIFFERENTIAL/PLATELET
Basophils Absolute: 0 K/uL (ref 0.0–0.1)
Basophils Relative: 0.7 % (ref 0.0–3.0)
Eosinophils Absolute: 0.1 K/uL (ref 0.0–0.7)
Eosinophils Relative: 1 % (ref 0.0–5.0)
HCT: 43.8 % (ref 39.0–52.0)
Hemoglobin: 14.9 g/dL (ref 13.0–17.0)
Lymphocytes Relative: 27.8 % (ref 12.0–46.0)
Lymphs Abs: 1.4 K/uL (ref 0.7–4.0)
MCHC: 34 g/dL (ref 30.0–36.0)
MCV: 90.1 fl (ref 78.0–100.0)
Monocytes Absolute: 0.5 K/uL (ref 0.1–1.0)
Monocytes Relative: 9.4 % (ref 3.0–12.0)
Neutro Abs: 3.2 K/uL (ref 1.4–7.7)
Neutrophils Relative %: 61.1 % (ref 43.0–77.0)
Platelets: 184 K/uL (ref 150.0–400.0)
RBC: 4.86 Mil/uL (ref 4.22–5.81)
RDW: 13.3 % (ref 11.5–15.5)
WBC: 5.2 K/uL (ref 4.0–10.5)

## 2024-04-05 LAB — TESTOSTERONE: Testosterone: 405.37 ng/dL (ref 300.00–890.00)

## 2024-04-05 LAB — PSA: PSA: 1.13 ng/mL (ref 0.10–4.00)

## 2024-04-05 MED ORDER — TESTOSTERONE 20.25 MG/ACT (1.62%) TD GEL: 2.0000 | Freq: Every day | TRANSDERMAL | 5 refills | Status: AC

## 2024-04-05 NOTE — Assessment & Plan Note (Signed)
 Cholesterol levels require close monitoring due to increased cardiovascular risk from testosterone  therapy. Aggressive cholesterol management is essential, especially for those over 50. Monitor cholesterol levels closely and aim for aggressive management goals.

## 2024-04-05 NOTE — Patient Instructions (Addendum)
 It was a pleasure seeing you today! Your health and satisfaction are our top priorities.  Bernardino Cone, MD  VISIT SUMMARY: You came in today for a follow-up on your testosterone  replacement therapy. You reported some improvements in energy, libido, and mood since starting the therapy. We also discussed your shoulder condition and your current activities. Additionally, we reviewed the potential risks and benefits of your current treatment plan.  YOUR PLAN: -HYPOGONADISM: Hypogonadism is a condition where the body doesn't produce enough testosterone . You are currently managing this with testosterone  gel, which has improved your energy, libido, and mood. We discussed the potential cardiovascular risks and the importance of managing your cholesterol levels aggressively. We also reviewed other potential risks like sleep apnea and testicular shrinkage. You should continue your exercise routine, especially using TRX machines, to maximize the benefits of the therapy. We will order testosterone  level, blood count, metabolic panel, and PSA test every three months. Please hold off on applying the testosterone  gel before lab work to get accurate trough levels. You have been prescribed a six-month supply of testosterone  gel with five refills. If fertility becomes a concern, we may consider an endocrinology referral for Clomid. Please do not fill your testosterone  prescription until lab results return.  -DYSLIPIDEMIA: Dyslipidemia means having abnormal cholesterol levels, which can increase your risk of cardiovascular disease. Given the potential cardiovascular risks associated with testosterone  therapy, it is crucial to monitor and manage your cholesterol levels closely. We aim for aggressive management goals, especially since you are over 50.  -OBSTRUCTIVE SLEEP APNEA RISK WITH TESTOSTERONE  THERAPY: Testosterone  therapy can worsen sleep apnea, particularly if you gain neck muscle mass from exercise. We will monitor  you for any symptoms of sleep apnea, and you may need to use a CPAP machine if symptoms develop.  INSTRUCTIONS: Please follow up with lab tests for testosterone  level, blood count, metabolic panel, and PSA every three months. Hold off on applying the testosterone  gel before lab work to get accurate trough levels. Do not fill your testosterone  prescription until lab results return. Continue your exercise routine, especially using TRX machines. Monitor for any symptoms of sleep apnea and consider using a CPAP machine if symptoms develop.  Your Providers PCP: Allwardt, Alyssa M, PA-C,  [phone]) Referring Provider: Allwardt, Alyssa M, PA-C,  [phone])  NEXT STEPS: [x]  Early Intervention: Schedule sooner appointment, call our on-call services, or go to emergency room if there is any significant Increase in pain or discomfort New or worsening symptoms Sudden or severe changes in your health [x]  Flexible Follow-Up: We recommend a No follow-ups on file. for optimal routine care. This allows for progress monitoring and treatment adjustments. [x]  Preventive Care: Schedule your annual preventive care visit! It's typically covered by insurance and helps identify potential health issues early. [x]  Lab & X-ray Appointments: Incomplete tests scheduled today, or call to schedule. X-rays: [facility] (M-F, 8:30am-noon or 1pm-5pm). [x]  Medical Information Release: Sign a release form at front desk to obtain relevant medical information we don't have.  MAKING THE MOST OF OUR FOCUSED 20 MINUTE APPOINTMENTS: [x]   Clearly state your top concerns at the beginning of the visit to focus our discussion [x]   If you anticipate you will need more time, please inform the front desk during scheduling - we can book multiple appointments in the same week. [x]   If you have transportation problems- use our convenient video appointments or ask about transportation support. [x]   We can get down to  business faster if  you use MyChart to update information before the visit and submit non-urgent questions before your visit. Thank you for taking the time to provide details through MyChart.  Let our nurse know and she can import this information into your encounter documents.  Arrival and Wait Times: [x]   Arriving on time ensures that everyone receives prompt attention. [x]   Early morning (8a) and afternoon (1p) appointments tend to have shortest wait times. [x]   Unfortunately, we cannot delay appointments for late arrivals or hold slots during phone calls.  Getting Answers and Following Up [x]   Simple Questions & Concerns: For quick questions or basic follow-up after your visit, reach us  at [phone] or MyChart messaging. [x]   Complex Concerns: If your concern is more complex, scheduling an appointment might be best. Discuss this with the staff to find the most suitable option. [x]   Lab & Imaging Results: We'll contact you directly if results are abnormal or you don't use MyChart. Most normal results will be on MyChart within 2-3 business days, with a review message from Dr. Jesus. Haven't heard back in 2 weeks? Need results sooner? Contact us  at [phone]. [x]   Referrals: Our referral coordinator will manage specialist referrals. The specialist's office should contact you within 2 weeks to schedule an appointment. Call us  if you haven't heard from them after 2 weeks.  Staying Connected [x]   MyChart: Activate your MyChart for the fastest way to access results and message us . See the last page of this paperwork for instructions on how to activate.  Bring to Your Next Appointment [x]   Medications: Please bring all your medication bottles to your next appointment to ensure we have an accurate record of your prescriptions. [x]   Health Diaries: If you're monitoring any health conditions at home, keeping a diary of your readings can be very helpful for discussions at your next  appointment.  Billing [x]   X-ray & Lab Orders: These are billed by separate companies. Contact the invoicing company directly for questions or concerns. [x]   Visit Charges: Discuss any billing inquiries with our administrative services team.  Your Satisfaction Matters [x]   Share Your Experience: We strive for your satisfaction! If you have any complaints, or preferably compliments, please let Dr. Jesus know directly or contact our Practice Administrators, Manuelita Rubin or [employer], by asking at the front desk.   Reviewing Your Records [x]   Review this early draft of your clinical encounter notes below and the final encounter summary tomorrow on MyChart after its been completed.  All orders placed so far are visible here: Hypogonadism in male -     Testosterone  -     CBC with Differential/Platelet -     Comprehensive metabolic panel with GFR -     PSA -     Testosterone ; Apply 2 Pump topically daily at 6 (six) AM.  Dispense: 75 g; Refill: 5  Dyslipidemia  Obstructive sleep apnea syndrome       TRX and Suspension Trainers Feature Why It Matters  Weight capacity If it can't reliably take your weight + dynamic movement, it's a safety risk. Some good ones support 300-350 lbs. (http://www.castillo-fisher.org/)  Good anchors Door anchor + suspension anchor (for beams/trees) + possibly wrap-around straps extend where you can use it. (http://www.castillo-fisher.org/)  Durable straps & hardware Strong webbing, solid buckles/carabiners, metal adjusters where possible. Cheaper units tend to have weaker plastic parts. (Total Shape)  Handle comfort & foot cradles Handles should allow grip; foot cradles matter if you'll do lower-body or more  dynamic moves. (loopsworld.com)  Easy adjustment & good strap markings Makes transitions smoother; helps ensure straps are of equal length. (Total Shape)  Warranty / support & extras A good brand usually gives a longer warranty or better customer service, and useful extras (travel bag, video  guides, etc.) bring value. (Total Shape)   ?? Top Picks for Best Value Based on recent review roundups, here are some systems that are repeatedly identified as good value: System What Makes It Good Value / Pros Considerations  TRX Home2 System High quality build; includes door/suspension anchors; solid reputation. Often picked as "best overall" or "best value" among mid-to-upper-tier systems.  Price tends to be higher than generic straps. Handles are foam (comfortable but less long-lasting than rubber/textured).   [employer] allows for wide/narrow/neutral angles; good versatility; fairly comfortable handles.  Some of the parts (foot cradles or smaller anchor hardware) may be less premium. Strap-length adjusters etc. not always as high end.  NOSSK Twin PRO / simpler brands Often cheaper; good for beginners; many users find NOSSK gives nearly TRX-level functionality for less money.  May cut cost in comfort, grip, or anchoring options; may have less durable materials; fewer extras.  GoFit Gravity Straps Highly adjustable, decent capacity; good for outdoor / travel.  Sometimes lacking in strap length markings or premium hardware; anchors might be more basic.   ?? What "Best Value" Looks Like by Price Tier To help you pick, here's roughly what to expect at different price levels ([place]  market): Price Range What You Get Good Value Examples  $30-$60 Basic straps, maybe one door anchor, simpler handles, lighter duty hardware. Decent for casual / travel use. Some NOSSK or generic brands; often what reviewers call "budget" options.  $60-$120 Much better build, more anchoring options, better handles/cradles, extras (video guides, travel bag, etc.). TRX Home2, [employer], good mid-tier brands.  $120+ [employer], lots of accessories, long warranties, often used in gyms or by serious users. TRX Pro or similar.

## 2024-04-05 NOTE — Progress Notes (Signed)
 ==============================  [facility]: [phone]   -- Medical Office Visit --  Patient: Timothy Lester      Age: 53 y.o.       Sex:  male  Date:   04/05/2024 Today's Healthcare Provider: Bernardino KANDICE Cone, MD  ==============================   Chief Complaint: Hypogonadism and Medication Refill  Discussed the use of AI scribe software for clinical note transcription with the patient, who gave verbal consent to proceed.  History of Present Illness 53 year old male who presents for a follow-up on testosterone  replacement therapy.  He is currently on testosterone  gel therapy, applying two squirts daily to his shoulders and arms, with some application to the chest. He has noticed a slight increase in energy levels, libido, and mood since starting the therapy and has started working out more frequently. No leg swelling, sleep apnea, or breast tenderness. He experiences some tossing and turning during sleep but does not wake up due to breathing issues.  He has a torn rotator cuff, which limits his ability to engage in certain physical activities, such as throwing a football. He reports that he is seeing a sports medicine specialist and has had an MRI done. He plans to address his shoulder issue after his wife's surgeries are completed.  He is married and has a wife who is undergoing surgeries. He engages in activities like shooting skeet and playing golf, although these are limited by his shoulder condition. He has a transgender child who transitioned from male to male in sixth or seventh grade and has had a couple of suicide attempts.   Background Reviewed: Problem List: has Calculus of gallbladder without cholecystitis without obstruction; S/P cholecystectomy; Full thickness rotator cuff tear; Inflammatory pain; Obstructive sleep apnea syndrome; Osteoarthritis of acromioclavicular joint; Pain in right shoulder; Anxiety; Prediabetes; High  cholesterol; Hypogonadism in male; Dyslipidemia; Cholelithiasis without obstruction; and Personal history of colon polyps, unspecified on their problem list. Past Medical History:  has a past medical history of Anxiety, Depression, Kidney stone, and PONV (postoperative nausea and vomiting). Past Surgical History:   has a past surgical history that includes Fracture surgery (Left); ERCP (N/A, 10/19/2013); Eye surgery; EUS (N/A, 10/26/2013); Cholecystectomy (N/A, 09/30/2020); and Femur Surgery (Left, 1994). Social History:   reports that he has never smoked. He has never used smokeless tobacco. He reports that he does not drink alcohol and does not use drugs. Family History:  family history includes ADD / ADHD in his brother and brother; Alcoholism in his paternal grandfather; Anxiety disorder in his mother; COPD in his mother; Depression in his brother; Diabetes in his paternal grandmother; Heart attack in his mother; Pancreatic cancer in his father; Stroke in his paternal grandfather. Allergies:  is allergic to octacosanol.   Medication Reconciliation: Current Outpatient Medications on File Prior to Visit  Medication Sig   escitalopram  (LEXAPRO ) 20 MG tablet Take 1 tablet (20 mg total) by mouth daily.   ibuprofen (ADVIL) 200 MG tablet Take 400-600 mg by mouth every 8 (eight) hours as needed for moderate pain (pain score 4-6).   No current facility-administered medications on file prior to visit.   Medications Discontinued During This Encounter  Medication Reason   Testosterone  20.25 MG/ACT (1.62%) GEL Reorder     Physical Exam:    04/05/2024    1:34 PM 03/07/2024   11:18 AM 03/07/2024    7:11 AM  Vitals with BMI  Height 5' 10    Weight 206 lbs 3 oz  BMI 29.59    Systolic 122 135 866  Diastolic 82 84 99  Pulse 73 61 95  Vital signs reviewed.  Nursing notes reviewed. Weight trend reviewed. Physical Activity: Unknown (10/10/2023)   Exercise Vital Sign    Days of Exercise per Week: 0  days    Minutes of Exercise per Session: Not on file   General Appearance:  No acute distress appreciable.   Well-groomed, healthy-appearing male.  Well proportioned with no abnormal fat distribution.  Good muscle tone. Pulmonary:  Normal work of breathing at rest, no respiratory distress apparent. SpO2: 98 %  Musculoskeletal: All extremities are intact.  Neurological:  Awake, alert, oriented, and engaged.  No obvious focal neurological deficits or cognitive impairments.  Sensorium seems unclouded.   Speech is clear and coherent with logical content. Psychiatric:  Appropriate mood, pleasant and cooperative demeanor, thoughtful and engaged during the exam  Results LABS   LH: 3.7 IU/L     01/24/2024    8:53 AM 01/24/2024    8:52 AM 10/11/2023    8:05 AM 07/26/2023    9:29 AM  PHQ 2/9 Scores  PHQ - 2 Score 1 1 0 1  PHQ- 9 Score 4   6    {   Results for orders placed or performed in visit on 04/05/24  Testosterone   Result Value Ref Range   Testosterone  405.37 300.00 - 890.00 ng/dL  CBC with Differential/Platelet  Result Value Ref Range   WBC 5.2 4.0 - 10.5 K/uL   RBC 4.86 4.22 - 5.81 Mil/uL   Hemoglobin 14.9 13.0 - 17.0 g/dL   HCT 56.1 60.9 - 47.9 %   MCV 90.1 78.0 - 100.0 fl   MCHC 34.0 30.0 - 36.0 g/dL   RDW 86.6 88.4 - 84.4 %   Platelets 184.0 150.0 - 400.0 K/uL   Neutrophils Relative % 61.1 43.0 - 77.0 %   Lymphocytes Relative 27.8 12.0 - 46.0 %   Monocytes Relative 9.4 3.0 - 12.0 %   Eosinophils Relative 1.0 0.0 - 5.0 %   Basophils Relative 0.7 0.0 - 3.0 %   Neutro Abs 3.2 1.4 - 7.7 K/uL   Lymphs Abs 1.4 0.7 - 4.0 K/uL   Monocytes Absolute 0.5 0.1 - 1.0 K/uL   Eosinophils Absolute 0.1 0.0 - 0.7 K/uL   Basophils Absolute 0.0 0.0 - 0.1 K/uL  Comprehensive metabolic panel with GFR  Result Value Ref Range   Sodium 139 135 - 145 mEq/L   Potassium 4.1 3.5 - 5.1 mEq/L   Chloride 103 96 - 112 mEq/L   CO2 27 19 - 32 mEq/L   Glucose, Bld 91 70 - 99 mg/dL   BUN 16 6 - 23  mg/dL   Creatinine, Ser 9.01 0.40 - 1.50 mg/dL   Total Bilirubin 0.6 0.2 - 1.2 mg/dL   Alkaline Phosphatase 56 39 - 117 U/L   AST 20 0 - 37 U/L   ALT 28 0 - 53 U/L   Total Protein 6.6 6.0 - 8.3 g/dL   Albumin 4.4 3.5 - 5.2 g/dL   GFR 10.64 >39.99 mL/min   Calcium 9.0 8.4 - 10.5 mg/dL  PSA  Result Value Ref Range   PSA 1.13 0.10 - 4.00 ng/mL  } No image results found. DG Chest 2 View Result Date: 03/07/2024 CLINICAL DATA:  Chest pain. EXAM: CHEST - 2 VIEW COMPARISON:  None Available. FINDINGS: The heart size and mediastinal contours are within normal limits. Both lungs are clear. The visualized skeletal  structures are unremarkable. IMPRESSION: No active cardiopulmonary disease. Electronically Signed   By: Lynwood Landy Raddle M.D.   On: 03/07/2024 07:54         ASSESSMENT & PLAN   Assessment & Plan Hypogonadism in male MONITORING PROTOCOL ?? Initial follow-up: 3 months after starting TRT ?? Lab monitoring: T level, CBC, CMP, PSA (>40) ?? Symptom reassessment using validated questionnaire ??? Surveillance for adverse effects: Polycythemia, edema, sleep apnea, breast tenderness ?? Annual evaluation: DRE (>50), lipid panel, A1c (if at risk) ? Long-term schedule: Q6 months after stable, then annually if no issues   Hypogonadism in male on testosterone  replacement therapy   Hypogonadism is managed with testosterone  gel, leading to improved energy, libido, and mood. The controversial nature of testosterone  replacement therapy and its potential cardiovascular risks were discussed, emphasizing the need for aggressive cholesterol management. Potential risks such as sleep apnea and testicular shrinkage were reviewed. Clomid was considered to boost LH and reduce testicular shrinkage but was not pursued due to side effects like gynecomastia and mood changes. Exercise, particularly with TRX machines, was recommended to maximize therapy benefits. Legal and political challenges of testosterone  therapy  were also discussed. Order testosterone  level, blood count, metabolic panel, and PSA test every three months. Advise holding testosterone  gel before lab work for trough levels. Prescribe a six-month supply of testosterone  gel with five refills. Recommend TRX exercises. Discuss potential endocrinology referral for Clomid if fertility is a concern. Advise against filling testosterone  prescription until lab results return. Dyslipidemia Cholesterol levels require close monitoring due to increased cardiovascular risk from testosterone  therapy. Aggressive cholesterol management is essential, especially for those over 50. Monitor cholesterol levels closely and aim for aggressive management goals. Obstructive sleep apnea syndrome Obstructive sleep apnea risk with testosterone  therapy   Testosterone  therapy may worsen sleep apnea, particularly with increased neck muscle mass from exercise. He is open to using CPAP if necessary. Monitor for sleep apnea symptoms and consider CPAP if symptoms develop.   ORDER ASSOCIATIONS  #   DIAGNOSIS / CONDITION ICD-10 ENCOUNTER ORDER     ICD-10-CM   1. Hypogonadism in male  E29.1 Testosterone     CBC with Differential/Platelet    Comprehensive metabolic panel with GFR    PSA    Testosterone  20.25 MG/ACT (1.62%) GEL    2. Dyslipidemia  E78.5     3. Obstructive sleep apnea syndrome  G47.33          Orders Placed in Encounter:   Lab Orders         Testosterone          CBC with Differential/Platelet         Comprehensive metabolic panel with GFR         PSA     Meds ordered this encounter  Medications   Testosterone  20.25 MG/ACT (1.62%) GEL    Sig: Apply 2 Pump topically daily at 6 (six) AM.    Dispense:  75 g    Refill:  5      This document was synthesized by artificial intelligence ([employer]) using HIPAA-compliant recording of the clinical interaction;   We discussed the use of AI scribe software for clinical note transcription with the patient, who gave  verbal consent to proceed. additional Info: This encounter employed state-of-the-art, real-time, collaborative documentation. The patient actively reviewed and assisted in updating their electronic medical record on a shared screen, ensuring transparency and facilitating joint problem-solving for the problem list, overview, and plan. This approach promotes accurate, informed care.  The treatment plan was discussed and reviewed in detail, including medication safety, potential side effects, and all patient questions. We confirmed understanding and comfort with the plan. Follow-up instructions were established, including contacting the office for any concerns, returning if symptoms worsen, persist, or new symptoms develop, and precautions for potential emergency department visits.

## 2024-04-05 NOTE — Assessment & Plan Note (Signed)
 MONITORING PROTOCOL ?? Initial follow-up: 3 months after starting TRT ?? Lab monitoring: T level, CBC, CMP, PSA (>40) ?? Symptom reassessment using validated questionnaire ??? Surveillance for adverse effects: Polycythemia, edema, sleep apnea, breast tenderness ?? Annual evaluation: DRE (>50), lipid panel, A1c (if at risk) ? Long-term schedule: Q6 months after stable, then annually if no issues   Hypogonadism in male on testosterone  replacement therapy   Hypogonadism is managed with testosterone  gel, leading to improved energy, libido, and mood. The controversial nature of testosterone  replacement therapy and its potential cardiovascular risks were discussed, emphasizing the need for aggressive cholesterol management. Potential risks such as sleep apnea and testicular shrinkage were reviewed. Clomid was considered to boost LH and reduce testicular shrinkage but was not pursued due to side effects like gynecomastia and mood changes. Exercise, particularly with TRX machines, was recommended to maximize therapy benefits. Legal and political challenges of testosterone  therapy were also discussed. Order testosterone  level, blood count, metabolic panel, and PSA test every three months. Advise holding testosterone  gel before lab work for trough levels. Prescribe a six-month supply of testosterone  gel with five refills. Recommend TRX exercises. Discuss potential endocrinology referral for Clomid if fertility is a concern. Advise against filling testosterone  prescription until lab results return.

## 2024-04-05 NOTE — Assessment & Plan Note (Signed)
 Obstructive sleep apnea risk with testosterone  therapy   Testosterone  therapy may worsen sleep apnea, particularly with increased neck muscle mass from exercise. He is open to using CPAP if necessary. Monitor for sleep apnea symptoms and consider CPAP if symptoms develop.

## 2024-04-06 ENCOUNTER — Ambulatory Visit: Payer: Self-pay | Admitting: Internal Medicine

## 2024-04-07 ENCOUNTER — Ambulatory Visit: Admitting: Internal Medicine

## 2024-05-03 ENCOUNTER — Telehealth: Payer: Self-pay

## 2024-05-03 ENCOUNTER — Other Ambulatory Visit (HOSPITAL_COMMUNITY): Payer: Self-pay

## 2024-05-03 NOTE — Telephone Encounter (Signed)
 Pharmacy Patient Advocate Encounter  Received notification from [facility] that Prior Authorization for Testosterone  20.25 MG/ACT(1.62%) gel  has been APPROVED from 05/03/24 to 05/03/2027. Ran test claim, Copay is $0. This test claim was processed through [facility]- copay amounts may vary at other pharmacies due to pharmacy/plan contracts, or as the patient moves through the different stages of their insurance plan.   PA #/Case ID/Reference #: [id number]

## 2024-05-05 ENCOUNTER — Other Ambulatory Visit (HOSPITAL_COMMUNITY): Payer: Self-pay

## 2024-06-26 ENCOUNTER — Encounter: Admitting: Physician Assistant

## 2024-10-03 ENCOUNTER — Ambulatory Visit: Admitting: Physician Assistant
